# Patient Record
Sex: Female | Born: 1955
Health system: Southern US, Community
[De-identification: ages and names within clinical notes are randomized; demographics above are authoritative.]

## PROBLEM LIST (undated history)

## (undated) DIAGNOSIS — M797 Fibromyalgia: Secondary | ICD-10-CM

## (undated) DIAGNOSIS — Z720 Tobacco use: Secondary | ICD-10-CM

## (undated) DIAGNOSIS — R011 Cardiac murmur, unspecified: Secondary | ICD-10-CM

## (undated) DIAGNOSIS — M1990 Unspecified osteoarthritis, unspecified site: Secondary | ICD-10-CM

## (undated) DIAGNOSIS — I359 Nonrheumatic aortic valve disorder, unspecified: Secondary | ICD-10-CM

## (undated) DIAGNOSIS — I10 Essential (primary) hypertension: Secondary | ICD-10-CM

## (undated) HISTORY — PX: SHOULDER SURGERY: SHX246

## (undated) HISTORY — DX: Essential (primary) hypertension: I10

## (undated) HISTORY — PX: CHOLECYSTECTOMY: SHX55

## (undated) HISTORY — DX: Nonrheumatic aortic valve disorder, unspecified: I35.9

## (undated) HISTORY — DX: Tobacco use: Z72.0

---

## 1998-12-24 ENCOUNTER — Encounter: Payer: Self-pay | Admitting: Internal Medicine

## 1998-12-24 ENCOUNTER — Ambulatory Visit (HOSPITAL_COMMUNITY): Admission: RE | Admit: 1998-12-24 | Discharge: 1998-12-24 | Payer: Self-pay

## 1999-02-11 ENCOUNTER — Ambulatory Visit (HOSPITAL_COMMUNITY): Admission: RE | Admit: 1999-02-11 | Discharge: 1999-02-11 | Payer: Self-pay | Admitting: Internal Medicine

## 1999-06-22 ENCOUNTER — Encounter: Admission: RE | Admit: 1999-06-22 | Discharge: 1999-09-20 | Payer: Self-pay | Admitting: Internal Medicine

## 1999-10-13 ENCOUNTER — Inpatient Hospital Stay (HOSPITAL_COMMUNITY): Admission: EM | Admit: 1999-10-13 | Discharge: 1999-10-15 | Payer: Self-pay | Admitting: Emergency Medicine

## 1999-10-14 ENCOUNTER — Encounter: Payer: Self-pay | Admitting: Neurology

## 2000-01-29 ENCOUNTER — Emergency Department (HOSPITAL_COMMUNITY): Admission: EM | Admit: 2000-01-29 | Discharge: 2000-01-29 | Payer: Self-pay | Admitting: Emergency Medicine

## 2000-07-11 ENCOUNTER — Ambulatory Visit (HOSPITAL_COMMUNITY): Admission: RE | Admit: 2000-07-11 | Discharge: 2000-07-11 | Payer: Self-pay | Admitting: Gastroenterology

## 2000-07-11 ENCOUNTER — Encounter (INDEPENDENT_AMBULATORY_CARE_PROVIDER_SITE_OTHER): Payer: Self-pay | Admitting: Specialist

## 2000-10-17 ENCOUNTER — Encounter: Admission: RE | Admit: 2000-10-17 | Discharge: 2000-10-17 | Payer: Self-pay | Admitting: *Deleted

## 2001-02-14 ENCOUNTER — Ambulatory Visit (HOSPITAL_COMMUNITY): Admission: RE | Admit: 2001-02-14 | Discharge: 2001-02-14 | Payer: Self-pay | Admitting: Gastroenterology

## 2001-12-09 ENCOUNTER — Encounter: Payer: Self-pay | Admitting: Emergency Medicine

## 2001-12-09 ENCOUNTER — Inpatient Hospital Stay (HOSPITAL_COMMUNITY): Admission: EM | Admit: 2001-12-09 | Discharge: 2001-12-10 | Payer: Self-pay | Admitting: Emergency Medicine

## 2001-12-14 HISTORY — PX: NM MYOCAR PERF WALL MOTION: HXRAD629

## 2001-12-21 ENCOUNTER — Ambulatory Visit (HOSPITAL_COMMUNITY): Admission: RE | Admit: 2001-12-21 | Discharge: 2001-12-22 | Payer: Self-pay | Admitting: Cardiology

## 2001-12-21 HISTORY — PX: CARDIAC CATHETERIZATION: SHX172

## 2002-04-10 ENCOUNTER — Encounter: Payer: Self-pay | Admitting: Gastroenterology

## 2002-04-10 ENCOUNTER — Ambulatory Visit (HOSPITAL_COMMUNITY): Admission: RE | Admit: 2002-04-10 | Discharge: 2002-04-10 | Payer: Self-pay | Admitting: Gastroenterology

## 2002-05-16 ENCOUNTER — Encounter (INDEPENDENT_AMBULATORY_CARE_PROVIDER_SITE_OTHER): Payer: Self-pay

## 2002-05-16 ENCOUNTER — Encounter: Payer: Self-pay | Admitting: Surgery

## 2002-05-16 ENCOUNTER — Observation Stay (HOSPITAL_COMMUNITY): Admission: RE | Admit: 2002-05-16 | Discharge: 2002-05-17 | Payer: Self-pay | Admitting: Surgery

## 2002-07-05 ENCOUNTER — Encounter: Admission: RE | Admit: 2002-07-05 | Discharge: 2002-07-05 | Payer: Self-pay | Admitting: Family Medicine

## 2002-07-05 ENCOUNTER — Encounter: Payer: Self-pay | Admitting: Family Medicine

## 2002-11-26 ENCOUNTER — Encounter: Payer: Self-pay | Admitting: Gastroenterology

## 2002-11-26 ENCOUNTER — Ambulatory Visit (HOSPITAL_COMMUNITY): Admission: RE | Admit: 2002-11-26 | Discharge: 2002-11-26 | Payer: Self-pay | Admitting: Gastroenterology

## 2002-11-27 ENCOUNTER — Encounter: Payer: Self-pay | Admitting: Gastroenterology

## 2002-11-27 ENCOUNTER — Ambulatory Visit (HOSPITAL_COMMUNITY): Admission: RE | Admit: 2002-11-27 | Discharge: 2002-11-27 | Payer: Self-pay | Admitting: Gastroenterology

## 2002-11-29 ENCOUNTER — Ambulatory Visit (HOSPITAL_COMMUNITY): Admission: RE | Admit: 2002-11-29 | Discharge: 2002-11-29 | Payer: Self-pay | Admitting: Gastroenterology

## 2003-11-13 ENCOUNTER — Encounter: Admission: RE | Admit: 2003-11-13 | Discharge: 2003-11-13 | Payer: Self-pay | Admitting: Surgery

## 2005-02-09 ENCOUNTER — Ambulatory Visit (HOSPITAL_COMMUNITY): Admission: RE | Admit: 2005-02-09 | Discharge: 2005-02-09 | Payer: Self-pay | Admitting: Gastroenterology

## 2005-02-09 ENCOUNTER — Encounter (INDEPENDENT_AMBULATORY_CARE_PROVIDER_SITE_OTHER): Payer: Self-pay | Admitting: Specialist

## 2008-12-22 ENCOUNTER — Encounter: Admission: RE | Admit: 2008-12-22 | Discharge: 2008-12-22 | Payer: Self-pay | Admitting: Specialist

## 2009-10-20 ENCOUNTER — Encounter: Admission: RE | Admit: 2009-10-20 | Discharge: 2009-10-20 | Payer: Self-pay | Admitting: Specialist

## 2009-12-22 ENCOUNTER — Encounter: Admission: RE | Admit: 2009-12-22 | Discharge: 2010-02-01 | Payer: Self-pay | Admitting: Orthopaedic Surgery

## 2010-09-24 NOTE — Op Note (Signed)
NAMEJANILAH, Murray                        ACCOUNT NO.:  [id number]   MEDICAL RECORD NO.:  [id number]                   PATIENT TYPE:  AMB   LOCATION:  ENDO                                 FACILITY:  [facility]   PHYSICIAN:  Anselmo Rod, M.D.               DATE OF BIRTH:  1955/08/16   DATE OF PROCEDURE:  11/29/2002  DATE OF DISCHARGE:                                 OPERATIVE REPORT   PROCEDURE PERFORMED:  Esophagogastroduodenoscopy.   ENDOSCOPIST:  Anselmo Rod, M.D.   INSTRUMENT USED:  Olympus video panendoscope.   INDICATION FOR PROCEDURE:  A 55 year old African-American female with a  history of severe abdominal pain status post laparoscopic cholecystectomy.  CT scan of the abdomen showed some dilated intrahepatic and extrahepatic  bile duct.  This was not confirmed by abdominal ultrasound; therefore, EGD  is being done to rule out peptic ulcer disease, esophagitis, gastritis, etc.  The patient had normal LFTs during a recent evaluation in the office.   PREPROCEDURE PREPARATION:  Informed consent was procured from the patient.  The patient had fasted for eight hours prior to the procedure.   PREPROCEDURE PHYSICAL:  VITAL SIGNS:  The patient had stable vital signs.  NECK:  Supple.  CHEST:  Clear to auscultation.  S1, S2 regular.  ABDOMEN:  Soft with epigastric tenderness on palpation with guarding.  No  rebound, no rigidity, no hepatosplenomegaly.  Well-healed surgical scars  present from previous laparoscopic cholecystectomy.   DESCRIPTION OF PROCEDURE:  The patient was placed in the left lateral  decubitus position and sedated with 60 mg of Demerol and 6 mg of Versed  intravenously.  Once the patient was adequately sedate and maintained on low-  flow oxygen and continuous cardiac monitoring, the Olympus video  panendoscope was advanced through the mouthpiece over the tongue, into the  esophagus under direct vision.  The entire esophagus appeared normal with no  evidence of ring, stricture, masses, esophagitis, or Barrett's mucosa.  The  scope was then advanced into the stomach.  The entire gastric mucosa and the  proximal small bowel appeared normal.  Retroflexion in the high cardia  revealed a small hiatal hernia.   IMPRESSION:  Normal EGD except for a small hiatal hernia.  No ulcers,  erosions, masses, or polyps seen.    RECOMMENDATIONS:  1. Continue Aciphex.  2. Avoid all nonsteroidals including aspirin for now.  3. Outpatient follow-up in the next one week for further recommendations.                                               Anselmo Rod, M.D.    JNM/MEDQ  D:  11/29/2002  T:  11/30/2002  Job:  [id number]   cc:   Lacretia Leigh. Quintella Reichert, M.D.  [address]  [place]  [place] [address]  Fax: [phone]

## 2010-09-24 NOTE — Procedures (Signed)
[facility]  Patient:    Valerie Murray, Valerie Murray Visit Number: [id number] MRN: [id number]          Service Type: END Location: ENDO Attending Physician:  Charna Elizabeth Dictated by:   Anselmo Rod, M.D. Admit Date:  02/14/2001 Discharge Date: 02/14/2001   CC:         Lacretia Leigh. Quintella Reichert, M.D.   Procedure Report  DATE OF BIRTH:  December 06, 1952.  PROCEDURE:  Esophagogastroduodenoscopy.  ENDOSCOPIST:  Anselmo Rod, M.D.  INSTRUMENT USED:  Olympus video panendoscope.  INDICATION FOR PROCEDURE:  A history of epigastric pain and abnormal fold on previous EGD in the stomach.  Repeat EGD is being done to re-evalaute the patients stomach and rule out any abnormalities versus malignancy.  PREPROCEDURE PREPARATION:  Informed consent was procured from the patient. The patient was fasted for eight hours prior to the procedure.  PREPROCEDURE PHYSICAL:  VITAL SIGNS:  The patient had stable vital signs.  NECK:  Supple.  CHEST:  Clear to auscultation.  S1, S2 regular.  ABDOMEN:  Soft with normal bowel sounds.  Minimal epigastric tenderness on palpation with guarding, no rebound, no rigidity, no hepatosplenomegaly, no masses palpable.  DESCRIPTION OF PROCEDURE:  The patient was placed in the left lateral decubitus position and sedated with 50 mg of Demerol and 5 mg of Versed intravenously.  Once the patient was adequately sedate and maintained on low-flow oxygen and continuous cardiac monitoring, the Olympus video panendoscope was advanced through the mouthpiece, over the tongue, into the esophagus under direct vision.  The entire esophagus appeared normal without evidence of ring, stricture, masses, lesions, esophagitis, or Barretts mucosal  The scope was then advanced to the stomach.  A small hiatal hernia was seen on high retroflexion.  The rest of the gastric mucosa appeared normal, and so did the proximal small bowel.  IMPRESSION:  Normal EGD  except for small hiatal hernia.  RECOMMENDATIONS: 1. Continue with PPIs at this time. 2. Outpatient follow-up in the next two weeks. Dictated by:   Anselmo Rod, M.D. Attending Physician:  Charna Elizabeth DD:  02/22/01 TD:  02/23/01 Job: [id number]

## 2010-09-24 NOTE — H&P (Signed)
[facility]  Patient:    Valerie Murray, Valerie Murray                     MRN: [id number] Adm. Date:  [id number] Attending:  Feliciana Rossetti                         History and Physical  CHIEF COMPLAINT:  Passed out.  HISTORY OF PRESENT ILLNESS:  A 55 year old black female who stood today to go to the bathroom and lost consciousness and went to the floor. She suffered no trauma. She did not have loss of bowel or bladder. She denies palpitations or chest pain, fevers, chills, nausea, vomiting, or diarrhea. She does endorse her longstanding diffuse pain that is worse in her right hand and left great toe.  PHYSICAL EXAMINATION:  Nontoxic in no acute distress. The patient is lying still in bed with paucity of faces, speaking slowly.  HEENT:  Pupils equal round and reactive to light. Funduscopic exam reveals no exudate or hemorrhage. Disk edges are normal.  VITAL SIGNS:  Heart rate 70, blood pressure 120/60 laying down, 120/66 standing, heart rate is 76 and 70 respectively.  NECK:   Supple. Lymph node survey is negative. Mucous membranes are moist. Oropharynx is without lesions.  HEART:  Regular rate and rhythm. No murmurs, rubs or gallops. Normal PMI.  LUNGS:  CTA. Good symmetric air movement.  ABDOMEN:  Soft, no hepatosplenomegaly. No rebound, no guard.  EXTREMITIES:  No CCE.  NEUROLOGIC:  Cranial nerves II-XII, muscle strength within normal limits.  RECTAL/PELVIC:  Refused.  SKIN:  Without rash or breakdown. Decreased range of motion to the left shoulder secondary to pain. Passive range of motion is also diminished.  DATA: CBC, differential, metabolic profile, ANA, sed rate pending. EKG, chest x-ray pending.  SOCIAL HISTORY:  Tobacco use, no alcohol.  FAMILY HISTORY:  No collagen vascular disease.  REVIEW OF SYSTEMS:  No diaphoresis. No shortness of breath. See HPI.  PAST SURGICAL HISTORY:  Shoulder surgery.  PAST MEDICAL HISTORY:  Fibromyalgia,  hypertension.  MEDICATIONS:  Toprol XL 50 q.d., Wellbutrin 400 q.d., Remeron 30 h.s.  ALLERGIES:  None.  ASSESSMENT/PLAN: 1. Syncope:  Suspect neurocardiogenic. The patient refusing cardiac workup.    Increase dose of Toprol and further workup if repeat event. 2. Fibromyalgia:  Apparently the patient has been severely disabled regarding    this issue over the past 15-20 years. Indeed she has remained in bed for as    long as 3 months a stretch when she would use the bathroom by crawling    back and forth to the commode. Will involve Child psychotherapist. 3. Hypertension:  Toprol as above. DD:  10/13/99 TD:  10/14/99 Job: [id number]

## 2010-09-24 NOTE — Procedures (Signed)
[facility]  Patient:    Valerie Murray, BACHTELL                     MRN: [id number] Proc. Date: 07/11/00 Adm. Date:  [id number] Attending:  Charna Elizabeth CC:         Feliciana Rossetti, M.D.   Procedure Report  DATE OF BIRTH:  1955-08-30.  PROCEDURE:  Esophagogastroduodenoscopy with biopsies.  ENDOSCOPIST:  Anselmo Rod, M.D.  INSTRUMENT USED:  Olympus video panendoscope.  INDICATION FOR PROCEDURE:  Epigastric pain not responding to PPIs in a 55 year old African-American female.  Rule out peptic ulcer disease, esophagitis, gastritis, etc.  PREPROCEDURE PREPARATION:  Informed consent was procured from the patient. The patient was fasted for eight hours prior to the procedure.  She also received 80 mg of gentamicin and 1 g of ampicillin prior to the procedure for a murmur.  PREPROCEDURE PHYSICAL:  VITAL SIGNS:  The patient had stable vital signs.  NECK:  Supple.  CHEST:  Clear to auscultation.  S1, S2 regular.  ABDOMEN:  Soft with epigastric tenderness on palpation with guarding.  No rebound or rigidity.  No hepatosplenomegaly.  DESCRIPTION OF PROCEDURE:  The patient was placed in the left lateral decubitus position and sedated with 40 mg of Demerol and 4 mg of Versed intravenously.  Once the patient was adequately sedate and maintained on low-flow oxygen and continuous cardiac monitoring, the Olympus video panendoscope was advanced through the mouthpiece, over the tongue, into the esophagus under direct vision.  The entire esophagus appeared normal without evidence of ring, stricture, masses, lesions, esophagitis, or Barretts mucosa.  The scope was then advanced into the stomach.  A small hiatal hernia was seen on high retroflexion.  There was a prominent gastric fold appreciated on retroflexion and not seen on antegrade view.  This was biopsied for pathology as well.  It was an erythematous fold with no frank ulceration over it.  The  rest of the gastric mucosa appeared normal.  The proximal small bowel appeared normal as well.  The patient tolerated the procedure well without complications.  IMPRESSION: 1. Normal-appearing esophagus. 2. Small hiatal hernia. 3. Prominent gastric fold seen on retroflexion, biopsied for pathology. 4. Normal proximal small bowel and distal half of the stomach.  RECOMMENDATIONS: 1. Await pathology results. 2. Continue PPIs for now. 3. Avoid all nonsteroidals. 4. Outpatient follow-up in the next two weeks. DD:  07/11/00 TD:  07/11/00 Job: [id number]

## 2010-09-24 NOTE — Cardiovascular Report (Signed)
Valerie Murray, Valerie Murray                        ACCOUNT NO.:  [id number]   MEDICAL RECORD NO.:  [id number]                   PATIENT TYPE:  OIB   LOCATION:  5729                                 FACILITY:  [facility]   PHYSICIAN:  Madaline Savage, M.D.             DATE OF BIRTH:  Oct 18, 1955   DATE OF PROCEDURE:  12/21/2001  DATE OF DISCHARGE:  12/22/2001                              CARDIAC CATHETERIZATION   INDICATIONS FOR PROCEDURE:  The patient has had a recent history of chest  pain prompting emergency room evaluation. She has also had some shortness of  breath.  She has chronic tobacco use, hypertension and known chronic aortic  regurgitation that has been stable.  A recent Cardiolite stress test has  shown the possibility of anterior wall ischemia that was felt to be high  risk.  We therefore scheduled the patient for cardiac catheterization today  on an elective outpatient basis.   RESULTS:  HEMODYNAMIC DATA:  Right atrial mean pressure was 7, right ventricle  pressure was 29/5, end-diastolic pressure of 9.  Pulmonary artery pressure  was 30/13, mean of 20.  Pulmonary capillary wedge mean pressure was 12.  A  wave 12, V wave 15.  Central aortic pressure was 160/65, mean of 100.  Left  ventricular pressure was 160/6, end-diastolic pressure 19.  The Fick cardiac  output was 4.9, Fick cardiac index was 3.0. Thermodilution cardiac output  was 4.0, cardiac index 2.5.   ANGIOGRAPHIC RESULTS:  The left main coronary artery was normal.   The left anterior descending coronary artery coursed to the cardiac apex and  gave rise to one major diagonal branch, which was entirely normal. That  diagonal branch arose distal to the first septal perforator branch.   There was an intermediate ramus branch arising from the distal left main  which was normal.   The circumflex was nondominant. It was normal.   The right coronary artery was a large and dominant vessel which was normal.   LEFT  VENTRICULOGRAM:  Left ventricular angiography was normal with normal  wall motion abnormalities. Ejection fraction 60%. The aortic root angiogram  showed moderately severe aortic regurgitation with no ectasia of the aortic  root.  The abdominal aorta showed no evidence of aneurysm formation. Both  renal arteries were entirely normal.   FINAL DIAGNOSES:  1. Chest pain and positive Cardiolite for anterior wall ischemia.  2.     Angiographically patent coronary arteries.  3. Normal left ventricular systolic function.  4. Moderately severe aortic regurgitation.                                               Madaline Savage, M.D.    WHG/MEDQ  D:  12/21/2001  T:  12/25/2001  Job:  [id number]   cc:  Lacretia Leigh. Quintella Reichert, M.D.   Cardiac Catheterization Laboratory

## 2010-09-24 NOTE — Consult Note (Signed)
[facility]  Patient:    Valerie Murray, Valerie Murray                     MRN: [id number] Proc. Date: 10/13/99 Adm. Date:  [id number] Attending:  Feliciana Rossetti CC:         Feliciana Rossetti, M.D.                          Consultation Report  DATE OF BIRTH:  [birth year]  REASON FOR CONSULTATION:  This 55 year old right handed black married female is seen in the emergency room and admitted by Dr. Quintella Reichert following an episode of unwitnessed syncope.  HISTORY OF PRESENT ILLNESS:  Ms. Klus states that she has had a 12 year history of fibromyalgia currently treated with Wellbutrin medication and Remeron. She also has a known history of hypertension for 15 years. Over the last 3 months, she has had episodes of a vague light headed sensation at times described as a whirling lasting as long as 20-30 minutes without associated nausea and vomiting, chest pain or palpitations. The frequency of these episodes can be several times per week, usually occurs in the morning when she first gets out of bed. She has also had 3 episodes of blackout spells which have been unwitnessed. These are not associated with any warning other than the light headed sensation. They are not associated with waking with having bitten her tongue or lost her urine or bowel control. She has not had these witnessed however. There has been no tongue soreness afterwards. Today, she had an episode of a light headed sensation followed by a blacking out that was unwitnessed. There were no apparent injuries and she came to the emergency room. She has not had any headaches, vision loss, double vision, swallowing problems, etc.  MEDICATIONS:  Wellbutrin 150 mg twice per day and 100 mg q.d., Remeron 30 mg at night, Lodine XL 400 mg once per day, Provigil 200 mg which she has not started taking yet and Toprol XL 50 mg q.d. for hypertension.  PAST MEDICAL HISTORY:  Significant for hypertension for 15 years,  heart murmurs and fibromyalgia characterized by frequent leg cramps. She states this has been evaluated at [facility] and confirmed to be fibromyalgia.  She has had studies in the emergency room including comprehensive metabolic panel which was normal, magnesium which was 2.1, a 12-lead EKG which was normal. PHYSICAL EXAMINATION:  GENERAL:  Revealed a well-developed, pleasant, black female in no acute distress. Weight was not obtained.  VITAL SIGNS:  Sitting blood pressure in the right and left 130/80, lying blood pressure in the right arm 130/80, standing in the right arm 130/80. Heart rate was 84.  NECK:  There were no bruits. Neck flexion extension maneuvers were unremarkable.  MENTAL STATUS:  She was alert and oriented x 3. She followed, 1, 2 and 3 step commands. Cranial nerves examination revealed visual fields to be full. The disks were flat. Extraocular movements were full. Cornuals were present and facial sensation was equal. There was no facial motor asymmetry. Hearing was present with air conduction greater than bone conduction. The tongue was midline, the uvula was midline and gags were present. Sternocleidomastoid and trapezius testing were normal.  MOTOR:  Revealed 5/5 strength but she gave very little effort at times, but when she did give effort there was 5/5. I did not see any definite spasms. She complained of muscle cramps while standing particularly in her  left foot but I could not see any. Her sensory examination was intact to pinprick, touch, joint position and vibration testing. She had an outstretched hand and arm tremor. Deep tendon reflexes were 2+ and plantar responses were down going.  IMPRESSION: 1. Unwitnessed syncope, code 780.2. 2. Vertigo, code 784.0. 3. Fibromyalgia, code 729.1.  PLAN:  Obtain and EEG, MRI study, have her go to telemetry per Dr. Quintella Reichert and consider tilt table testing after the above. DD:  10/14/99 TD:  10/14/99 Job:  [id number]

## 2010-09-24 NOTE — Discharge Summary (Signed)
   Valerie Murray, Valerie Murray                        ACCOUNT NO.:  [id number]   MEDICAL RECORD NO.:  [id number]                   PATIENT TYPE:  INP   LOCATION:  2002                                 FACILITY:  [facility]   PHYSICIAN:  Beverly A. Delanna Ahmadi, N.P.               DATE OF BIRTH:  1955-10-26   DATE OF ADMISSION:  12/09/2001  DATE OF DISCHARGE:  12/10/2001                                 DISCHARGE SUMMARY   HISTORY OF PRESENT ILLNESS:  The patient is a 55 year old African American  female patient with a prior medical history of hypertension, GERD, and  fibromyalgia and also aortic insufficiency.  She came into the ER with  complaints of chest pain starting about 2 o'clock in the day.  She was seen  by Dr. Jenne Campus about 8:30 at night in the ER.  It was decided that she would  be admitted overnight for observation and also to rule out an MI.  On December 10, 2001, she was seen by Dr. Jacinto Halim.  Her enzymes thus far have been  negative.  There is one set pending.  She will be discharged if the third  set is also negative.  It was thought that her pain was not ischemic  related; however, she will undergo an outpatient Cardiolite and also  echocardiogram to follow her aortic insufficiency.   DISCHARGE MEDICATIONS:  1. Nexium 40 mg once per day.  2. Aspirin 81 mg every day.  3. Toprol XL 50 mg once per day.   ACTIVITY:  As tolerated.   DIET:  She should be on a low saturated fat diet.   LABORATORY DATA:  Her lipid profile, her last CK-MB, and troponin were  pending at the time of this dictation.  Her other labs are unremarkable.   FOLLOW UP:  She has a followup with Dr. Elsie Lincoln August 21 at 3 p.m.  She will  have a Persantine Cardiolite and a 2-D echocardiogram done on August 8.   DISCHARGE DIAGNOSES:  1. Chest pain thought not related to ischemic coronary artery disease.  2. Aortic insufficiency.  3. Fibromyalgia.  4. Gastroesophageal reflux disease.              Beverly A. Delanna Ahmadi, N.P.    BAB/MEDQ  D:  12/10/2001  T:  12/14/2001  Job:  [id number]

## 2010-09-24 NOTE — Op Note (Signed)
NAMELIBNI, FUSARO                        ACCOUNT NO.:  [id number]   MEDICAL RECORD NO.:  [id number]                   PATIENT TYPE:  AMB   LOCATION:  DAY                                  FAC[facility]   PHYSICIAN:  Abigail Miyamoto, M.D.              DATE OF BIRTH:  1955/11/05   DATE OF PROCEDURE:  05/16/2002  DATE OF DISCHARGE:                                 OPERATIVE REPORT   PREOPERATIVE DIAGNOSIS:  Biliary dyskinesia.   POSTOPERATIVE DIAGNOSIS:  Biliary dyskinesia.   PROCEDURE:  Laparoscopic cholecystectomy with intraoperative cholangiogram.   SURGEON:  Abigail Miyamoto, M.D.   ASSISTANT:  Ollen Gross. Carolynne Edouard, M.D.   ANESTHESIA:  General endotracheal anesthesia.   ESTIMATED BLOOD LOSS:  Minimal.   FINDINGS:  The patient was found to have a normal cholangiogram.   DESCRIPTION OF PROCEDURE:  The patient was brought to the operating room and  positively identified.  She was placed supine on the operating room table,  and general anesthesia was induced.  Her abdomen was then prepped and draped  in the usual sterile fashion.  Using a #15 blade, a small transverse  incision was made just below the umbilicus through a previous incision.  The  incision was carried down to the fascia, which was then opened with a  scalpel.  A hemostat was used to pass into the peritoneal cavity.  A 0  Vicryl pursestring suture was then placed around the fascial opening.  The  Hasson port was placed through the opening and insufflation of the abdomen  begun.  An 11 mm port was placed in the patient's epigastrium and two 5 mm  ports were placed in the patient's right flank under direct vision.  The  gallbladder was then identified and retracted above the liver bed.  Several  adhesions to the gallbladder were then taken down bluntly.  The cystic duct  was then dissected out.  It was clipped once distally and promptly  transected with scissors.  An angiocatheter was then inserted in the right  upper quadrant under direct vision.  The cholangiocatheter was then passed  through the angiocatheter and placed into the cystic duct.  The  cholangiogram was then performed under direct fluoroscopy.  Good flow of  contrast was seen into the entire biliary system, common bile duct, and  duodenum without evidence of abnormalities.  The cholangiocatheter was then  removed under direct vision.  The cystic duct was then clipped three times  proximally and completely transected.  The cystic artery and a branch were  then clipped twice proximally and distally and transected with scissors.  The gallbladder was then dissected free from the liver bed with the  electrocautery.  Hemostasis appeared to be achieved in the liver bed with  the cautery.  The gallbladder was then removed through the incision at the  umbilicus.  The 0 Vicryl at the umbilicus was tied in place, closing the  fascial defect.  The liver bed was again examined and hemostasis achieved.  All ports were then removed under direct vision until the abdomen was  deflated.  All incisions were then anesthetized with 0.25% Marcaine and then  closed with 4-0 Monocryl subcuticular sutures.  Steri-Strips,  gauze, and tape were then applied.  The patient tolerated the procedure  well.  All sponge, needle, and instrument counts were correct at the end of  the procedure.  The patient was then extubated in the operating room and  taken in stable condition to the recovery room.                                               Abigail Miyamoto, M.D.    DB/MEDQ  D:  05/16/2002  T:  05/16/2002  Job:  [id number]

## 2010-09-24 NOTE — Discharge Summary (Signed)
[facility]  Patient:    Valerie Murray, Valerie Murray                     MRN: [id number] Adm. Date:  [id number] Disc. Date: [id number] Attending:  Feliciana Rossetti                           Discharge Summary  DIAGNOSES: 1. Syncope and collapse. 2. Hypertension. 3. Fibromyalgia. 4. Dizziness. 5. Giddiness.  REASON FOR HOSPITALIZATION:  Syncope and collapse.  HOSPITAL COURSE:  Patient was admitted to a general medical bed and neurologic consult was obtained by Dr. Genene Churn. Love.  The plan was for electroencephalogram, MRI and cardiologic consult.  Social work consult was obtained for patient.  Neurologic workup was unrevealing and then patient refused cardiologic workup.  I stressed the importance of this to patient and she again refused this workup.  Patient stabilized and had no further syncopal episode and denied any dizziness upon standing.  CONDITION AT DISCHARGE:  Patient was well-appearing, ambulating with ease, with normal and stable vital signs.  DISCHARGE MEDICATIONS: 1. Wellbutrin 150 mg b.i.d. ______ . 2. Remeron 30 mg q.h.s. 3. Ritalin 5 mg b.i.d.  FOLLOWUP:  Followup was with Dr. Feliciana Rossetti one week following discharge. DD:  11/28/99 TD:  12/01/99 Job: [id number]

## 2010-09-24 NOTE — Op Note (Signed)
NAME:  Valerie Murray, Valerie Murray            ACCOUNT NO.:  [id number]   MEDICAL RECORD NO.:  [id number]          PATIENT TYPE:  AMB   LOCATION:  ENDO                         FACILITY:  [facility]   PHYSICIAN:  Anselmo Rod, M.D.  DATE OF BIRTH:  06/08/1955   DATE OF PROCEDURE:  02/09/2005  DATE OF DISCHARGE:                                 OPERATIVE REPORT   PROCEDURE PERFORMED:  Colonoscopy with random biopsies.   ENDOSCOPIST:  Charna Elizabeth, M.D.   INSTRUMENT USED:  Olympus video colonoscope.   INDICATIONS FOR PROCEDURE:  The patient is a 55 year old African-American  female undergoing a screening colonoscopy. The patient has a history of  change in bowel habits, worsening diarrhea and a history of anemia.  Rule  out colonic polyps, masses, etc.   PREPROCEDURE PREPARATION:  Informed consent was procured from the patient.  The patient was fasted for eight hours prior to the procedure and prepped  with a bottle of magnesium citrate and a gallon of GoLytely the night prior  to the procedure.  The risks and benefits of the procedure including a 10%  miss rate for polyps or cancers was discussed with the patient as well.   PREPROCEDURE PHYSICAL:  The patient had stable vital signs.  Neck supple.  Chest clear to auscultation.  S1 and S2 regular.  Abdomen soft with normal  bowel sounds.   DESCRIPTION OF PROCEDURE:  The patient was placed in left lateral decubitus  position and sedated with 40 mg of Demerol and 6 mg of Versed in slow  incremental doses.  Once the patient was adequately sedated and maintained  on low flow oxygen and continuous cardiac monitoring, the Olympus video  colonoscope was advanced from the rectum to the cecum.  The appendicular  orifice and ileocecal valve were clearly visualized and photographed.  Terminal ileum appeared healthy and without lesions.  No other masses or  polyps were seen.  A couple of very early diverticula were seen in the left  and right colon.  Random  colon biopsies were done to rule out collagenous  versus microscopic colitis.  The patient tolerated the procedure well  without complication.   IMPRESSION:  1.  Loss of vascular markings seen throughout the colonic mucosa.  2.  Normal terminal ileum.  3.  Few early diverticula.  4.  No masses or polyps recognized.   RECOMMENDATIONS:  1.  Await pathology results.  2.  Avoid all nonsteroidals for now.  3.  Repeat colonoscopy in the next 10 years unless the patient develops any      abnormal symptoms in the interim.  4.  Outpatient followup in the next four weeks for further recommendations.      Anselmo Rod, M.D.  Electronically Signed     JNM/MEDQ  D:  02/09/2005  T:  02/09/2005  Job:  [id number]   cc:   Lacretia Leigh. Quintella Reichert, M.D.  [address]  [place]  [place] [address]

## 2011-03-15 ENCOUNTER — Ambulatory Visit: Payer: Self-pay

## 2011-03-22 ENCOUNTER — Other Ambulatory Visit: Payer: Self-pay | Admitting: Gastroenterology

## 2011-11-09 ENCOUNTER — Emergency Department (HOSPITAL_COMMUNITY): Payer: Medicare Other

## 2011-11-09 ENCOUNTER — Encounter (HOSPITAL_COMMUNITY): Payer: Self-pay | Admitting: Emergency Medicine

## 2011-11-09 ENCOUNTER — Emergency Department (HOSPITAL_COMMUNITY)
Admission: EM | Admit: 2011-11-09 | Discharge: 2011-11-09 | Disposition: A | Discharge status: Home / Self Care | Payer: Medicare Other | Attending: Emergency Medicine | Admitting: Emergency Medicine

## 2011-11-09 DIAGNOSIS — M25579 Pain in unspecified ankle and joints of unspecified foot: Secondary | ICD-10-CM | POA: Insufficient documentation

## 2011-11-09 DIAGNOSIS — F17200 Nicotine dependence, unspecified, uncomplicated: Secondary | ICD-10-CM | POA: Insufficient documentation

## 2011-11-09 DIAGNOSIS — M25476 Effusion, unspecified foot: Secondary | ICD-10-CM | POA: Insufficient documentation

## 2011-11-09 DIAGNOSIS — M129 Arthropathy, unspecified: Secondary | ICD-10-CM | POA: Insufficient documentation

## 2011-11-09 DIAGNOSIS — IMO0002 Reserved for concepts with insufficient information to code with codable children: Secondary | ICD-10-CM | POA: Insufficient documentation

## 2011-11-09 DIAGNOSIS — S9030XA Contusion of unspecified foot, initial encounter: Secondary | ICD-10-CM | POA: Insufficient documentation

## 2011-11-09 DIAGNOSIS — M25473 Effusion, unspecified ankle: Secondary | ICD-10-CM | POA: Insufficient documentation

## 2011-11-09 DIAGNOSIS — S9031XA Contusion of right foot, initial encounter: Secondary | ICD-10-CM

## 2011-11-09 DIAGNOSIS — IMO0001 Reserved for inherently not codable concepts without codable children: Secondary | ICD-10-CM | POA: Insufficient documentation

## 2011-11-09 DIAGNOSIS — M79609 Pain in unspecified limb: Secondary | ICD-10-CM | POA: Insufficient documentation

## 2011-11-09 HISTORY — DX: Unspecified osteoarthritis, unspecified site: M19.90

## 2011-11-09 HISTORY — DX: Cardiac murmur, unspecified: R01.1

## 2011-11-09 HISTORY — DX: Fibromyalgia: M79.7

## 2011-11-09 MED ORDER — HYDROCODONE-ACETAMINOPHEN 5-325 MG PO TABS: 1.0000 | ORAL_TABLET | ORAL | Status: AC | PRN

## 2011-11-09 MED ORDER — HYDROCODONE-ACETAMINOPHEN 5-325 MG PO TABS
1.0000 | ORAL_TABLET | Freq: Once | ORAL | Status: AC
  Administered 2011-11-09: 1 via ORAL
  Filled 2011-11-09: qty 1

## 2011-11-09 NOTE — ED Provider Notes (Signed)
Medical screening examination/treatment/procedure(s) were performed by non-physician practitioner and as supervising physician I was immediately available for consultation/collaboration.    Vida Roller, MD 11/09/11 [phone]

## 2011-11-09 NOTE — ED Notes (Signed)
Pt states she hit her right foot on a cabinet this afternoon and as the day has progressed the pain has increased and she is unable to bear weight on it   Pt states the pain is mainly in the foot on the outside proximal to the 5th toe

## 2011-11-09 NOTE — ED Provider Notes (Signed)
History     CSN: [id number]  Arrival date & time 11/09/11  2025   First MD Initiated Contact with Patient 11/09/11 2140      Chief Complaint  Patient presents with  . Foot Pain    (Consider location/radiation/quality/duration/timing/severity/associated sxs/prior treatment) HPI Comments: Patient here with right foot pain s/p hitting her foot on the base of a cabinet this evening - she states that she has been using ice and elevation without relief of the pain - reports pain to right lateral foot at the MTP joint - states that she is unable to walk since the event because of the pain.  Patient is a 56 y.o. female presenting with lower extremity pain. The history is provided by the patient. No language interpreter was used.  Foot Pain This is a new problem. The current episode started today. The problem occurs constantly. The problem has been unchanged. Associated symptoms include arthralgias and joint swelling. Pertinent negatives include no abdominal pain, anorexia, change in bowel habit, chest pain, chills, congestion, coughing, diaphoresis, fatigue, fever, headaches, myalgias, nausea, neck pain, numbness, rash, sore throat, swollen glands, urinary symptoms, vertigo, visual change, vomiting or weakness. The symptoms are aggravated by standing and walking. She has tried nothing for the symptoms. The treatment provided no relief.    Past Medical History  Diagnosis Date  . Heart murmur   . Arthritis   . Fibromyalgia     Past Surgical History  Procedure Date  . Shoulder surgery   . Cholecystectomy     Family History  Problem Relation Age of Onset  . Hypertension Other     History  Substance Use Topics  . Smoking status: Current Everyday Smoker    Types: Cigarettes  . Smokeless tobacco: Not on file  . Alcohol Use: Yes    OB History    Grav Para Term Preterm Abortions TAB SAB Ect Mult Living                  Review of Systems  Constitutional: Negative for fever,  chills, diaphoresis and fatigue.  HENT: Negative for congestion, sore throat and neck pain.   Respiratory: Negative for cough.   Cardiovascular: Negative for chest pain.  Gastrointestinal: Negative for nausea, vomiting, abdominal pain, anorexia and change in bowel habit.  Musculoskeletal: Positive for joint swelling and arthralgias. Negative for myalgias.  Skin: Negative for rash.  Neurological: Negative for vertigo, weakness, numbness and headaches.  All other systems reviewed and are negative.    Allergies  Review of patient's allergies indicates no known allergies.  Home Medications   Current Outpatient Rx  Name Route Sig Dispense Refill  . CALCIUM + D PO Oral Take 1 tablet by mouth once a week.    Marland Kitchen OLMESARTAN MEDOXOMIL 40 MG PO TABS Oral Take 40 mg by mouth daily.      BP 169/71  Pulse 74  Temp 98.4 F (36.9 C) (Oral)  Resp 18  SpO2 98%  Physical Exam  Nursing note and vitals reviewed. Constitutional: She is oriented to person, place, and time. She appears well-developed and well-nourished. No distress.  HENT:  Head: Normocephalic and atraumatic.  Right Ear: External ear normal.  Left Ear: External ear normal.  Nose: Nose normal.  Mouth/Throat: Oropharynx is clear and moist. No oropharyngeal exudate.  Eyes: Conjunctivae are normal. Pupils are equal, round, and reactive to light. No scleral icterus.  Neck: Normal range of motion. Neck supple.  Cardiovascular: Normal rate, regular rhythm and normal heart sounds.  Exam reveals no gallop and no friction rub.   No murmur heard. Pulmonary/Chest: Effort normal and breath sounds normal. No respiratory distress. She has no wheezes. She has no rales. She exhibits no tenderness.  Abdominal: Soft. Bowel sounds are normal. She exhibits no distension. There is no tenderness.  Musculoskeletal:       Right foot: She exhibits tenderness, bony tenderness and swelling. She exhibits normal range of motion, normal capillary refill and  no deformity.       Feet:  Lymphadenopathy:    She has no cervical adenopathy.  Neurological: She is alert and oriented to person, place, and time. No cranial nerve deficit. She exhibits normal muscle tone. Coordination normal.  Skin: Skin is warm and dry. No rash noted. No erythema. No pallor.  Psychiatric: She has a normal mood and affect. Her behavior is normal. Judgment and thought content normal.    ED Course  Procedures (including critical care time)  Labs Reviewed - No data to display Dg Foot Complete Right  11/09/2011  *RADIOLOGY REPORT*  Clinical Data: Lateral foot pain, swelling.None  RIGHT FOOT COMPLETE - 3+ VIEW  Comparison: None.  Findings: No acute bony abnormality.  Specifically, no fracture, subluxation, or dislocation.  Soft tissues are intact.  IMPRESSION: Normal study.  Original Report Authenticated By: Cyndie Chime, M.D.     Right foot contusion   MDM  Patient here with right lateral foot contusion - no evidence of fracture - will place in post op shoe and crutches - pain control and ortho follow up        Scarlette Calico C. [place], [place] 11/09/11 2212

## 2011-12-01 HISTORY — PX: US ECHOCARDIOGRAPHY: HXRAD669

## 2012-12-15 ENCOUNTER — Encounter: Payer: Self-pay | Admitting: *Deleted

## 2012-12-18 ENCOUNTER — Encounter: Payer: Self-pay | Admitting: Cardiovascular Disease

## 2012-12-19 ENCOUNTER — Encounter: Payer: Self-pay | Admitting: Cardiovascular Disease

## 2012-12-19 ENCOUNTER — Ambulatory Visit (INDEPENDENT_AMBULATORY_CARE_PROVIDER_SITE_OTHER): Payer: Medicare Other | Admitting: Cardiovascular Disease

## 2012-12-19 VITALS — BP 132/70 | HR 65 | Resp 16 | Ht 67.0 in | Wt 133.4 lb

## 2012-12-19 DIAGNOSIS — Q231 Congenital insufficiency of aortic valve: Secondary | ICD-10-CM

## 2012-12-19 DIAGNOSIS — K589 Irritable bowel syndrome without diarrhea: Secondary | ICD-10-CM

## 2012-12-19 DIAGNOSIS — I359 Nonrheumatic aortic valve disorder, unspecified: Secondary | ICD-10-CM

## 2012-12-19 DIAGNOSIS — Q2381 Bicuspid aortic valve: Secondary | ICD-10-CM

## 2012-12-19 DIAGNOSIS — R0600 Dyspnea, unspecified: Secondary | ICD-10-CM

## 2012-12-19 DIAGNOSIS — J4489 Other specified chronic obstructive pulmonary disease: Secondary | ICD-10-CM

## 2012-12-19 DIAGNOSIS — F17200 Nicotine dependence, unspecified, uncomplicated: Secondary | ICD-10-CM

## 2012-12-19 DIAGNOSIS — I10 Essential (primary) hypertension: Secondary | ICD-10-CM

## 2012-12-19 DIAGNOSIS — R0989 Other specified symptoms and signs involving the circulatory and respiratory systems: Secondary | ICD-10-CM

## 2012-12-19 DIAGNOSIS — Z720 Tobacco use: Secondary | ICD-10-CM

## 2012-12-19 DIAGNOSIS — M797 Fibromyalgia: Secondary | ICD-10-CM

## 2012-12-19 DIAGNOSIS — J449 Chronic obstructive pulmonary disease, unspecified: Secondary | ICD-10-CM

## 2012-12-19 DIAGNOSIS — I351 Nonrheumatic aortic (valve) insufficiency: Secondary | ICD-10-CM

## 2012-12-19 DIAGNOSIS — IMO0001 Reserved for inherently not codable concepts without codable children: Secondary | ICD-10-CM

## 2012-12-19 NOTE — Patient Instructions (Addendum)
Your physician has requested that you have an echocardiogram. Echocardiography is a painless test that uses sound waves to create images of your heart. It provides your doctor with information about the size and shape of your heart and how well your heart's chambers and valves are working. This procedure takes approximately one hour. There are no restrictions for this procedure.  WE WILL CALL YOU WITH THE RESULTS.  Your physician recommends that you schedule a follow-up appointment in: ONE YEAR

## 2012-12-28 ENCOUNTER — Encounter: Payer: Self-pay | Admitting: Cardiovascular Disease

## 2012-12-28 DIAGNOSIS — M797 Fibromyalgia: Secondary | ICD-10-CM | POA: Insufficient documentation

## 2012-12-28 DIAGNOSIS — Q231 Congenital insufficiency of aortic valve: Secondary | ICD-10-CM | POA: Insufficient documentation

## 2012-12-28 DIAGNOSIS — F17200 Nicotine dependence, unspecified, uncomplicated: Secondary | ICD-10-CM | POA: Insufficient documentation

## 2012-12-28 DIAGNOSIS — J449 Chronic obstructive pulmonary disease, unspecified: Secondary | ICD-10-CM | POA: Insufficient documentation

## 2012-12-28 DIAGNOSIS — K589 Irritable bowel syndrome without diarrhea: Secondary | ICD-10-CM | POA: Insufficient documentation

## 2012-12-28 DIAGNOSIS — I10 Essential (primary) hypertension: Secondary | ICD-10-CM | POA: Insufficient documentation

## 2012-12-28 DIAGNOSIS — I351 Nonrheumatic aortic (valve) insufficiency: Secondary | ICD-10-CM | POA: Insufficient documentation

## 2012-12-28 NOTE — Assessment & Plan Note (Signed)
If her symptoms worsen it'll be worthwhile for her to undergo formal pulmonary function tests to confirm this diagnosis. This will also be necessary if she is to undergo aortic valve replacement with sternotomy.

## 2012-12-28 NOTE — Progress Notes (Signed)
Patient ID: Valerie Murray, female   DOB: 08-09-1955, 57 y.o.   MRN: [id number]     Reason for office visit Follow-up valvular heart disease  Valerie Murray has had progressively worsening aortic insufficiency well documented in  notes going back to the 1980s. Over the last several years there has been substantial worsening of the severity of the aortic insufficiency. Her last echocardiogram showed moderate to severe insufficiency and also documented moderate pulmonary hypertension.  She also continues to smoke and probably has COPD. She also wears diagnoses of fibromyalgia irritable bowel syndrome. She is well treated systemic hypertension  She describes exertional dyspnea. She does not have chest pain or syncope. No Known Allergies  Current Outpatient Prescriptions  Medication Sig Dispense Refill  . Calcium Carbonate-Vitamin D (CALCIUM + D PO) Take 1 tablet by mouth once a week.      Marland Kitchen HYDROcodone-acetaminophen (NORCO) 10-325 MG per tablet Take by mouth as needed.      Marland Kitchen olmesartan (BENICAR) 40 MG tablet Take 40 mg by mouth daily.       No current facility-administered medications for this visit.    Past Medical History  Diagnosis Date  . Heart murmur   . Arthritis   . Fibromyalgia   . Aortic valve disease     severe AI  . Systemic hypertension   . Tobacco abuse     Past Surgical History  Procedure Laterality Date  . Shoulder surgery    . Cholecystectomy    . US echocardiography  12/01/2011    mod.to severe AI,mild valvular aortic stenosis,LA mod. dilated,trace MR,mild to mod. TR,severe pulmonary hypertension,trace PI  . Nm myocar perf wall motion  12/14/2001    anterior wall ischemia  . Cardiac catheterization  12/21/2001    moderately severe aortic regurg.,normal patent coronary arteries    Family History  Problem Relation Age of Onset  . Hypertension Other   . Stroke Mother   . Heart attack Father   . Cancer Sister     breast    History   Social History  .  Marital Status: Married    Spouse Name: N/A    Number of Children: N/A  . Years of Education: N/A   Occupational History  . Not on file.   Social History Main Topics  . Smoking status: Current Every Day Smoker -- 0.25 packs/day    Types: Cigarettes  . Smokeless tobacco: Not on file  . Alcohol Use: Yes     Comment: occas.  . Drug Use: No  . Sexual Activity: Not on file   Other Topics Concern  . Not on file   Social History Narrative  . No narrative on file    Review of systems: The patient specifically denies any chest pain at rest or with exertion, dyspnea at rest, orthopnea, paroxysmal nocturnal dyspnea, syncope, palpitations, focal neurological deficits, intermittent claudication, lower extremity edema, unexplained weight gain, cough, hemoptysis or wheezing.  The patient also denies abdominal pain, nausea, vomiting, dysphagia,  polyuria, polydipsia, dysuria, hematuria, frequency, urgency, abnormal bleeding or bruising, fever, chills, unexpected weight changes, mood swings, change in skin or hair texture, change in voice quality, auditory or visual problems, allergic reactions or rashes, new musculoskeletal complaints other than usual "aches and pains". On bouts of diarrhea or constipation.   PHYSICAL EXAM BP 132/70  Pulse 65  Ht 5\' 7"  (1.702 m)  Wt 133 lb 6.4 oz (60.51 kg)  BMI 20.89 kg/m2  General: Alert, oriented x3, no distress Head: no  evidence of trauma, PERRL, EOMI, no exophtalmos or lid lag, no myxedema, no xanthelasma; normal ears, nose and oropharynx Neck: normal jugular venous pulsations and no hepatojugular reflux; brisk carotid pulses without delay and no carotid bruits Chest: clear to auscultation, no signs of consolidation by percussion or palpation, normal fremitus, symmetrical and full respiratory excursions Cardiovascular: Dynamic but nondisplaced apical impulse, regular rhythm, normal first and second heart sounds, no rubs or gallops, distal clip is heard.  Grade 3/6 decrescendo diastolic murmur in the aortic focus. The murmur radiates up towards her right sternal border and the carotids. There is also an accompanying systolic 3/6  early peaking systolic murmur Abdomen: no tenderness or distention, no masses by palpation, no abnormal pulsatility or arterial bruits, normal bowel sounds, no hepatosplenomegaly Extremities: no clubbing, cyanosis or edema; 2+ radial, ulnar and brachial pulses bilaterally; 2+ right femoral, posterior tibial and dorsalis pedis pulses; 2+ left femoral, posterior tibial and dorsalis pedis pulses; no subclavian or femoral bruits Neurological: grossly nonfocal   EKG: NSR  Lipid Panel  No results found for this basename: chol, trig, hdl, cholhdl, vldl, ldlcalc    BMET No results found for this basename: na, k, cl, co2, glucose, bun, creatinine, calcium, gfrnonaa, gfraa     ASSESSMENT AND PLAN Severe aortic insufficiency Valerie Murray has severe aortic insufficiency. This is most likely due to a bicuspid aortic valve. The left ventricle is normal in size and systolic left ventricular function was normal by echocardiogram performed in July of 2013. She complains of shortness of breath but it is very challenging to distinguish her dyspnea from smoking-related lung disease. Similarly her last echo described severe pulmonary hypertension with a PA pressure of 70 mm mercury. Whether this is secondary to lung disease or aortic insufficiency is unclear. I think if there is any evidence of left ventricular dysfunction she should undergo aortic valve replacement. This should be assessed yearly with echocardiography, according to valve disease management guidelines. She is on appropriate agent for her blood pressure (angiotensin receptor blocker) to help reduce the significance of aortic insufficiency.  COPD (chronic obstructive pulmonary disease) If her symptoms worsen it'll be worthwhile for her to undergo formal pulmonary function  tests to confirm this diagnosis. This will also be necessary if she is to undergo aortic valve replacement with sternotomy.  Tobacco abuse We spent a long time addressing the benefits of smoking cessation. This will be critically important before she would have any chest surgery. Should focus on smoking cessation as an immediate and long-term goal. We discussed modalities to achieve this.  HTN (hypertension) Satisfactory control.  Orders Placed This Encounter  Procedures  . EKG 12-Lead  . 2D Echocardiogram without contrast   Meds ordered this encounter  Medications  . HYDROcodone-acetaminophen (NORCO) 10-325 MG per tablet    Sig: Take by mouth as needed.    Junious Silk, MD, [facility] [phone] office [phone] pager

## 2012-12-28 NOTE — Assessment & Plan Note (Signed)
We spent a long time addressing the benefits of smoking cessation. This will be critically important before she would have any chest surgery. Should focus on smoking cessation as an immediate and long-term goal. We discussed modalities to achieve this.

## 2012-12-28 NOTE — Assessment & Plan Note (Signed)
Valerie Murray has severe aortic insufficiency. This is most likely due to a bicuspid aortic valve. The left ventricle is normal in size and systolic left ventricular function was normal by echocardiogram performed in July of 2013. She complains of shortness of breath but it is very challenging to distinguish her dyspnea from smoking-related lung disease. Similarly her last echo described severe pulmonary hypertension with a PA pressure of 70 mm mercury. Whether this is secondary to lung disease or aortic insufficiency is unclear. I think if there is any evidence of left ventricular dysfunction she should undergo aortic valve replacement. This should be assessed yearly with echocardiography, according to valve disease management guidelines. She is on appropriate agent for her blood pressure (angiotensin receptor blocker) to help reduce the significance of aortic insufficiency.

## 2012-12-28 NOTE — Assessment & Plan Note (Signed)
Satisfactory control. 

## 2012-12-31 ENCOUNTER — Ambulatory Visit (HOSPITAL_COMMUNITY)
Admission: RE | Admit: 2012-12-31 | Discharge: 2012-12-31 | Disposition: A | Discharge status: Home / Self Care | Payer: PRIVATE HEALTH INSURANCE | Source: Ambulatory Visit | Attending: Cardiovascular Disease | Admitting: Cardiovascular Disease

## 2012-12-31 DIAGNOSIS — R0600 Dyspnea, unspecified: Secondary | ICD-10-CM

## 2012-12-31 DIAGNOSIS — I359 Nonrheumatic aortic valve disorder, unspecified: Secondary | ICD-10-CM | POA: Insufficient documentation

## 2012-12-31 DIAGNOSIS — I517 Cardiomegaly: Secondary | ICD-10-CM | POA: Insufficient documentation

## 2012-12-31 DIAGNOSIS — I059 Rheumatic mitral valve disease, unspecified: Secondary | ICD-10-CM | POA: Insufficient documentation

## 2012-12-31 DIAGNOSIS — I079 Rheumatic tricuspid valve disease, unspecified: Secondary | ICD-10-CM | POA: Insufficient documentation

## 2012-12-31 NOTE — Progress Notes (Signed)
2D Echo Performed 12/31/2012    Kennethia Lynes, RCS  

## 2013-09-12 IMAGING — CR DG FOOT COMPLETE 3+V*R*
3 series · 3 of 3 positions shown · non-contrast
Comparison: None.

CLINICAL DATA: Lateral foot pain, swelling.None

RIGHT FOOT COMPLETE - 3+ VIEW

[x foot ap right]
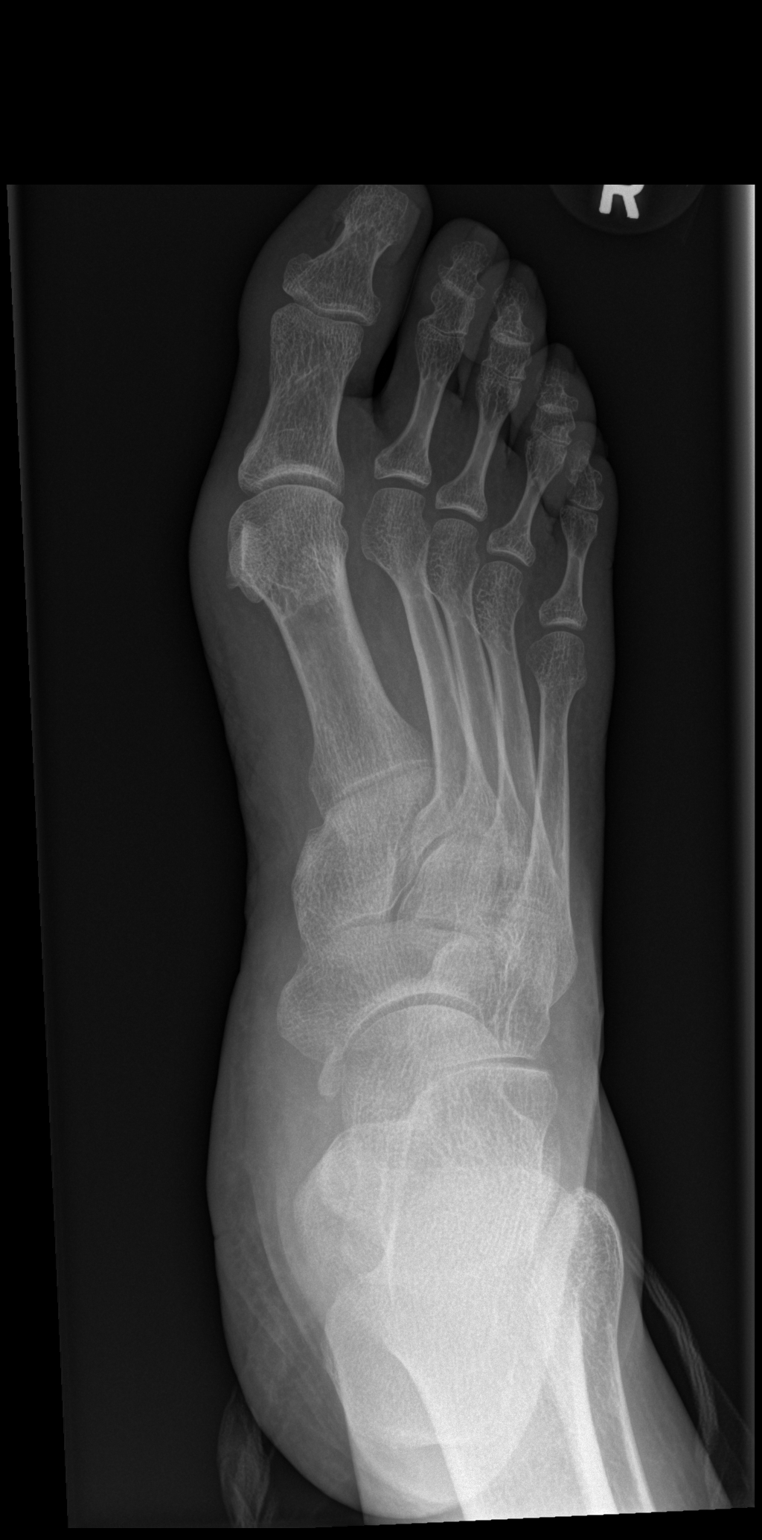

[x foot obl right]
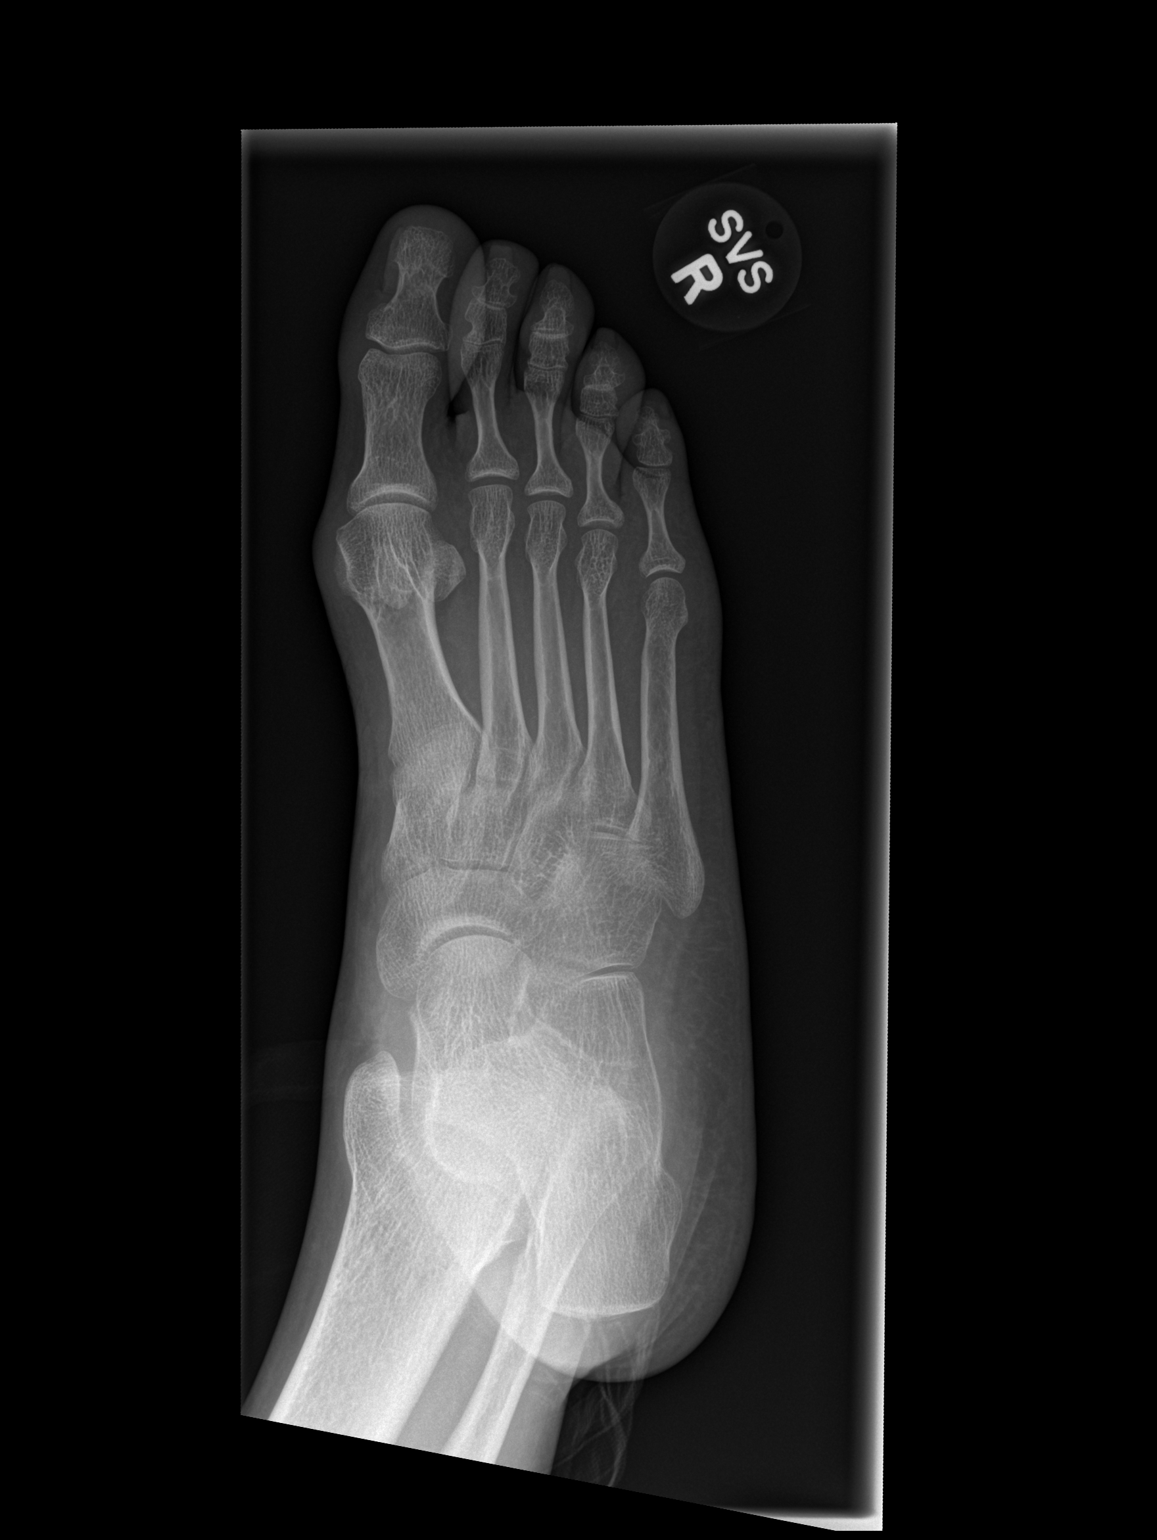

[x foot lat right]
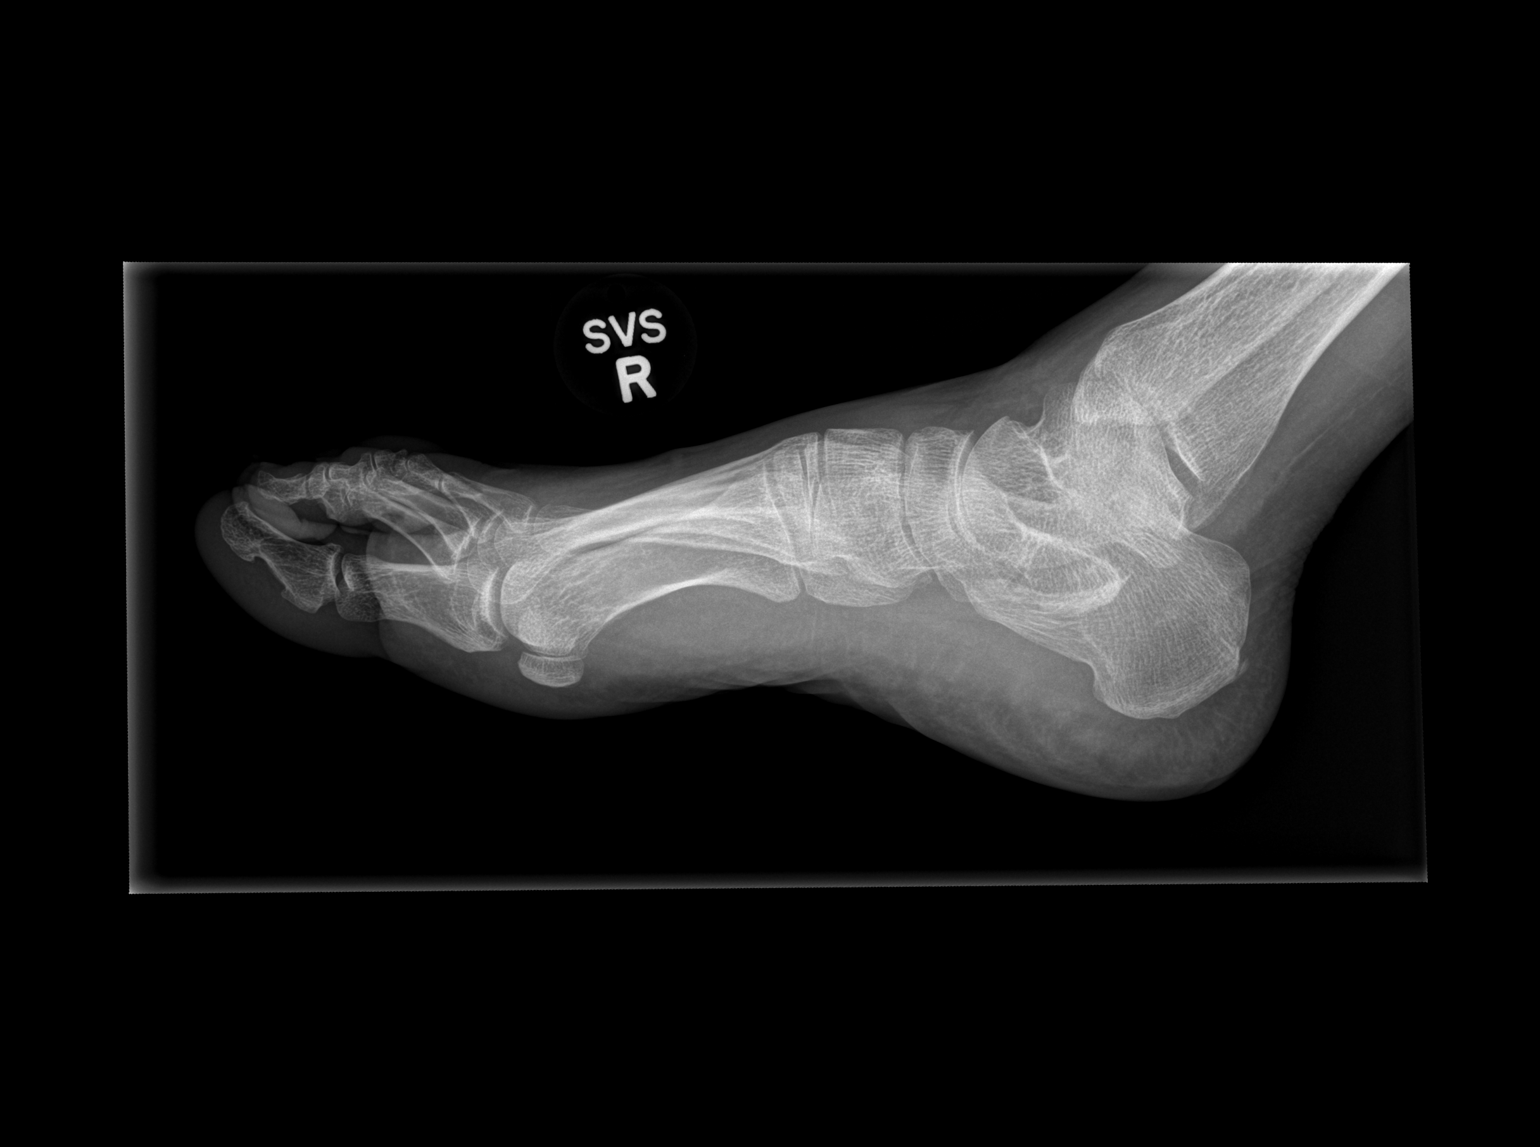

[3 of 3 positions shown; findings below may reference images not displayed]

FINDINGS: No acute bony abnormality.  Specifically, no fracture,
subluxation, or dislocation.  Soft tissues are intact.
IMPRESSION: Normal study.

## 2013-12-23 ENCOUNTER — Ambulatory Visit (INDEPENDENT_AMBULATORY_CARE_PROVIDER_SITE_OTHER): Payer: PRIVATE HEALTH INSURANCE | Admitting: Cardiovascular Disease

## 2013-12-23 ENCOUNTER — Encounter: Payer: Self-pay | Admitting: Cardiovascular Disease

## 2013-12-23 VITALS — BP 152/68 | HR 67 | Resp 16 | Ht 67.0 in | Wt 126.7 lb

## 2013-12-23 DIAGNOSIS — F17200 Nicotine dependence, unspecified, uncomplicated: Secondary | ICD-10-CM

## 2013-12-23 DIAGNOSIS — Q231 Congenital insufficiency of aortic valve: Secondary | ICD-10-CM

## 2013-12-23 DIAGNOSIS — I10 Essential (primary) hypertension: Secondary | ICD-10-CM

## 2013-12-23 DIAGNOSIS — I359 Nonrheumatic aortic valve disorder, unspecified: Secondary | ICD-10-CM

## 2013-12-23 DIAGNOSIS — I351 Nonrheumatic aortic (valve) insufficiency: Secondary | ICD-10-CM

## 2013-12-23 DIAGNOSIS — Z720 Tobacco use: Secondary | ICD-10-CM

## 2013-12-23 NOTE — Patient Instructions (Signed)
Your physician has requested that you have an echocardiogram. Echocardiography is a painless test that uses sound waves to create images of your heart. It provides your doctor with information about the size and shape of your heart and how well your heart's chambers and valves are working. This procedure takes approximately one hour. There are no restrictions for this procedure.  Your physician recommends that you schedule a follow-up appointment in:ONE YEAR with Dr.Croitoru

## 2013-12-23 NOTE — Progress Notes (Signed)
Patient ID: Valerie Murray, female   DOB: 1956/02/17, 58 y.o.   MRN: [id number]     Reason for office visit Aortic insufficiency  Valerie Murray is a 58 year old woman with severe aortic insufficiency related to a bicuspid aortic valve with normal left ventricular size and systolic function. She continues to smoke cigarettes, albeit only one pack every 3-4 days. She has treated systemic hypertension. Previous echocardiography at one point raised the concern for severe pulmonary hypertension, but her last echocardiogram in 2014 showed minimally elevated systolic PA pressure of around 39 mm Hg. She denies shortness of breath at rest or with exertion. She has not had palpitations, syncope or exertional chest pain. Her only complaints are menopause related hot flashes and sweats. She bears diagnoses of fibromyalgia and irritable bowel syndrome. Does not have known coronary disease.  No Known Allergies  Current Outpatient Prescriptions  Medication Sig Dispense Refill  . Calcium Carbonate-Vitamin D (CALCIUM + D PO) Take 1 tablet by mouth once a week.      Marland Kitchen. HYDROcodone-acetaminophen (NORCO) 10-325 MG per tablet Take by mouth as needed.      Marland Kitchen. olmesartan (BENICAR) 40 MG tablet Take 40 mg by mouth daily.       No current facility-administered medications for this visit.    Past Medical History  Diagnosis Date  . Heart murmur   . Arthritis   . Fibromyalgia   . Aortic valve disease     severe AI  . Systemic hypertension   . Tobacco abuse     Past Surgical History  Procedure Laterality Date  . Shoulder surgery    . Cholecystectomy    . [place] echocardiography  12/01/2011    mod.to severe AI,mild valvular aortic stenosis,LA mod. dilated,trace MR,mild to mod. TR,severe pulmonary hypertension,trace PI  . Nm myocar perf wall motion  12/14/2001    anterior wall ischemia  . Cardiac catheterization  12/21/2001    moderately severe aortic regurg.,normal patent coronary arteries    Family History    Problem Relation Age of Onset  . Hypertension Other   . Stroke Mother   . Heart attack Father   . Cancer Sister     breast    History   Social History  . Marital Status: Married    Spouse Name: N/A    Number of Children: N/A  . Years of Education: N/A   Occupational History  . Not on file.   Social History Main Topics  . Smoking status: Current Every Day Smoker -- 0.25 packs/day    Types: Cigarettes  . Smokeless tobacco: Not on file  . Alcohol Use: Yes     Comment: occas.  . Drug Use: No  . Sexual Activity: Not on file   Other Topics Concern  . Not on file   Social History Narrative  . No narrative on file    Review of systems: The patient specifically denies any chest pain at rest or with exertion, dyspnea at rest or with exertion, orthopnea, paroxysmal nocturnal dyspnea, syncope, palpitations, focal neurological deficits, intermittent claudication, lower extremity edema, unexplained weight gain, cough, hemoptysis or wheezing.  The patient also denies abdominal pain, nausea, vomiting, dysphagia, diarrhea, constipation, polyuria, polydipsia, dysuria, hematuria, frequency, urgency, abnormal bleeding or bruising, fever, chills, unexpected weight changes, mood swings, change in skin or hair texture, change in voice quality, auditory or visual problems, allergic reactions or rashes, new musculoskeletal complaints other than usual "aches and pains".   PHYSICAL EXAM BP 152/68  Pulse 67  Ht 5\' 7"  (1.702 m)  Wt 126 lb 11.2 oz (57.471 kg)  BMI 19.84 kg/m2 Recheck 140/60 mm Hg General: Alert, oriented x3, no distress Head: no evidence of trauma, PERRL, EOMI, no exophtalmos or lid lag, no myxedema, no xanthelasma; normal ears, nose and oropharynx Neck: normal jugular venous pulsations and no hepatojugular reflux; brisk carotid pulses without delay and loud bilateral carotid bruits, radiating from chest. Chest: clear to auscultation, no signs of consolidation by percussion or  palpation, normal fremitus, symmetrical and full respiratory excursions Cardiovascular: normal position and hyperdynamic apical impulse, regular rhythm, normal first and second heart sounds, no rubs or gallops. Remarkably mild 4/6 diastolic decrescendo murmur in the aortic focus radiating broadly in the precordium and even up to her carotids, accompanying grade 3-4/6 systolic ejection murmur that is early peaking Abdomen: no tenderness or distention, no masses by palpation, no abnormal pulsatility or arterial bruits, normal bowel sounds, no hepatosplenomegaly Extremities: no clubbing, cyanosis or edema; her femoral pulse has a "water hammer " quality; 2+ radial, ulnar and brachial pulses bilaterally; 2+ right femoral, posterior tibial and dorsalis pedis pulses; 2+ left femoral, posterior tibial and dorsalis pedis pulses; no subclavian or femoral bruits Neurological: grossly nonfocal   EKG: Normal sinus rhythm, no sign of left ventricular hypertrophy    ASSESSMENT AND PLAN  Valerie Murray has chronic severe aortic insufficiency but currently appears to be asymptomatic. Her last assessment one year ago showed normal left ventricular size and ejection fraction. She is approaching the age at which she could possibly receive a biological prosthesis is that of a mechanical device, thereby avoiding the complications of anticoagulation. As long as she remains asymptomatic, would prefer to delay surgery until that point. Will perform yearly echoes to reevaluate left ventricular size and systolic function, since LV performance deterioration would dictate the need for earlier valve replacement.  Again strongly encouraged to quit smoking altogether. She should accomplish this before she is to undergo sternotomy and valve replacement.  Junious Silk, MD, [facility] [phone] office [phone] pager

## 2013-12-26 ENCOUNTER — Ambulatory Visit (HOSPITAL_COMMUNITY)
Admission: RE | Admit: 2013-12-26 | Discharge: 2013-12-26 | Disposition: A | Discharge status: Home / Self Care | Payer: PRIVATE HEALTH INSURANCE | Source: Ambulatory Visit | Attending: Cardiology | Admitting: Cardiology

## 2013-12-26 DIAGNOSIS — Q231 Congenital insufficiency of aortic valve: Secondary | ICD-10-CM

## 2013-12-26 DIAGNOSIS — I359 Nonrheumatic aortic valve disorder, unspecified: Secondary | ICD-10-CM

## 2013-12-26 DIAGNOSIS — I351 Nonrheumatic aortic (valve) insufficiency: Secondary | ICD-10-CM

## 2013-12-26 NOTE — Progress Notes (Signed)
2D Echocardiogram Complete.  12/26/2013   Owais Pruett, RDCS  

## 2014-01-01 ENCOUNTER — Telehealth: Payer: Self-pay | Admitting: *Deleted

## 2014-01-01 DIAGNOSIS — I359 Nonrheumatic aortic valve disorder, unspecified: Secondary | ICD-10-CM

## 2014-01-01 NOTE — Telephone Encounter (Signed)
Echo results called to patient.  Voiced understanding.  Order placed for repeat echo in one year.

## 2014-01-01 NOTE — Telephone Encounter (Signed)
Message copied by Vita Barley on Wed Jan 01, 2014  3:42 PM ------      Message from: Thurmon Fair      Created: Thu Dec 26, 2013 10:47 PM       Echo similar to last year, reevaluate in 12 months. No need for surgery yet, unless she develops symptoms. Doubt accuracy of PA pressure estimation - will review myself ------

## 2014-11-06 ENCOUNTER — Encounter: Payer: Self-pay | Admitting: Certified Nurse Midwife

## 2014-11-06 ENCOUNTER — Ambulatory Visit (INDEPENDENT_AMBULATORY_CARE_PROVIDER_SITE_OTHER): Payer: Medicaid Other | Admitting: Certified Nurse Midwife

## 2014-11-06 ENCOUNTER — Other Ambulatory Visit: Payer: Self-pay | Admitting: Certified Nurse Midwife

## 2014-11-06 VITALS — BP 122/70 | HR 76 | Temp 97.5°F | Ht 67.0 in | Wt 120.0 lb

## 2014-11-06 DIAGNOSIS — Z01419 Encounter for gynecological examination (general) (routine) without abnormal findings: Secondary | ICD-10-CM

## 2014-11-06 DIAGNOSIS — Z0000 Encounter for general adult medical examination without abnormal findings: Secondary | ICD-10-CM

## 2014-11-06 MED ORDER — VARENICLINE TARTRATE 0.5 MG X 11 & 1 MG X 42 PO MISC
ORAL | Status: DC
Start: 2014-11-06 — End: 2015-01-02

## 2014-11-06 NOTE — Progress Notes (Signed)
Patient ID: Valerie Murray, female   DOB: 1956-03-17, 59 y.o.   MRN: [id number]   Subjective:     Valerie Murray is a 59 y.o. female here for a routine exam.  Current complaints: none.  Greater than 7 years since last period.    Sister had endometrial and BCA, dx around 60 years of age and is living.  Smokes about 5 cigs/day.  Sexually active "in a blue moon".   Dental exam last exam unknown.  Has false teeth but cannot wear them because of bone deformities.  Declines STD blood testing.   Personal health questionnaire:  Is patient Ashkenazi Jewish, have a family history of breast and/or ovarian cancer: yes Is there a family history of uterine cancer diagnosed at age < 80, gastrointestinal cancer, urinary tract cancer, family member who is a Personnel officer syndrome-associated carrier: no Is the patient overweight and hypertensive, family history of diabetes, personal history of gestational diabetes, preeclampsia or PCOS: yes Is patient over 15, have PCOS,  family history of premature CHD under age 19, diabetes, smoke, have hypertension or peripheral artery disease:  yes At any time, has a partner hit, kicked or otherwise hurt or frightened you?: no Over the past 2 weeks, have you felt down, depressed or hopeless?: no Over the past 2 weeks, have you felt little interest or pleasure in doing things?:no   Gynecologic History No LMP recorded. Patient is postmenopausal. Contraception: post menopausal status Last Pap: unknown. Results were: normal according to the patient Last mammogram: unknown. Results were: normal according to the patient.   Obstetric History OB History  No data available    Past Medical History  Diagnosis Date  . Heart murmur   . Arthritis   . Fibromyalgia   . Aortic valve disease     severe AI  . Systemic hypertension   . Tobacco abuse     Past Surgical History  Procedure Laterality Date  . Shoulder surgery    . Cholecystectomy    . US echocardiography  12/01/2011   mod.to severe AI,mild valvular aortic stenosis,LA mod. dilated,trace MR,mild to mod. TR,severe pulmonary hypertension,trace PI  . Nm myocar perf wall motion  12/14/2001    anterior wall ischemia  . Cardiac catheterization  12/21/2001    moderately severe aortic regurg.,normal patent coronary arteries     Current outpatient prescriptions:  .  Calcium Carbonate-Vitamin D (CALCIUM + D PO), Take 1 tablet by mouth once a week., Disp: , Rfl:  .  HYDROcodone-acetaminophen (NORCO) 10-325 MG per tablet, Take by mouth as needed., Disp: , Rfl:  .  olmesartan (BENICAR) 40 MG tablet, Take 40 mg by mouth daily., Disp: , Rfl:  .  varenicline (CHANTIX STARTING MONTH PAK) 0.5 MG X 11 & 1 MG X 42 tablet, Take a 0.5mg  tablet 1X/day for 3 days, increase to twice a day for 4 days, then take 1 mg tablet twice a day., Disp: 53 tablet, Rfl: 0 No Known Allergies  History  Substance Use Topics  . Smoking status: Current Every Day Smoker -- 0.25 packs/day    Types: Cigarettes  . Smokeless tobacco: Current User  . Alcohol Use: 0.6 oz/week    0 Standard drinks or equivalent, 1 Glasses of wine per week     Comment: occas.    Family History  Problem Relation Age of Onset  . Hypertension Other   . Stroke Mother   . Heart attack Father   . Cancer Sister     breast  Review of Systems  Constitutional: negative for fatigue and weight loss Respiratory: negative for cough and wheezing Cardiovascular: negative for chest pain, fatigue and palpitations, does have mitral valve insufficiency Gastrointestinal: negative for abdominal pain and change in bowel habits Musculoskeletal:negative for myalgias Neurological: negative for gait problems and tremors Behavioral/Psych: negative for abusive relationship, depression Endocrine: negative for temperature intolerance   Genitourinary:negative for abnormal menstrual periods, genital lesions, hot flashes, sexual problems and vaginal discharge Integument/breast: negative  for breast lump, breast tenderness, nipple discharge and skin lesion(s)    Objective:       BP 122/70 mmHg  Pulse 76  Temp(Src) 97.5 F (36.4 C)  Ht 5\' 7"  (1.702 m)  Wt 120 lb (54.432 kg)  BMI 18.79 kg/m2 General:   alert  Skin:   no rash or abnormalities  Lungs:   clear to auscultation bilaterally  Heart:   regular rate + murmur, click, rub or gallop, S1&S2 present  Breasts:   normal without suspicious masses, skin or nipple changes or axillary nodes  Abdomen:  normal findings: no organomegaly, soft, non-tender and no hernia  Pelvis:  External genitalia: normal general appearance Urinary system: urethral meatus normal and bladder without fullness, nontender Vaginal: normal without tenderness, induration or masses Cervix: normal appearance Adnexa: normal bimanual exam Uterus: anteverted and non-tender, normal size   Lab Review Urine pregnancy test Labs reviewed yes Radiologic studies reviewed no  50% of 30 min visit spent on counseling and coordination of care.   Assessment:    Healthy female exam.   Smoking cessation   Plan:    Education reviewed: calcium supplements, depression evaluation, low fat, low cholesterol diet, safe sex/STD prevention, self breast exams, skin cancer screening, smoking cessation and weight bearing exercise. Contraception: post menopausal status. Mammogram ordered. Follow up in: 1 year.   Meds ordered this encounter  Medications  . varenicline (CHANTIX STARTING MONTH PAK) 0.5 MG X 11 & 1 MG X 42 tablet    Sig: Take a 0.5mg  tablet 1X/day for 3 days, increase to twice a day for 4 days, then take 1 mg tablet twice a day.    Dispense:  53 tablet    Refill:  0   Orders Placed This Encounter  Procedures  . Candidiasis, PCR  . Bacterial Vaginosis DNA  . MM DIGITAL SCREENING BILATERAL    Standing Status: Future     Number of Occurrences:      Standing Expiration Date: 01/06/2016    Order Specific Question:  Reason for Exam (SYMPTOM  OR  DIAGNOSIS REQUIRED)    Answer:  annual exam    Order Specific Question:  Is the patient pregnant?    Answer:  No    Order Specific Question:  Preferred imaging location?    [facility]

## 2014-11-07 ENCOUNTER — Other Ambulatory Visit: Payer: Self-pay | Admitting: Certified Nurse Midwife

## 2014-11-07 DIAGNOSIS — N760 Acute vaginitis: Principal | ICD-10-CM

## 2014-11-07 DIAGNOSIS — B9689 Other specified bacterial agents as the cause of diseases classified elsewhere: Secondary | ICD-10-CM

## 2014-11-07 LAB — PAP IG (IMAGE GUIDED)

## 2014-11-07 MED ORDER — TINIDAZOLE 500 MG PO TABS
2.0000 g | ORAL_TABLET | Freq: Every day | ORAL | Status: AC
Start: 2014-11-07 — End: 2014-11-09

## 2014-11-10 LAB — BACTERIAL VAGINOSIS DNA
ATOPOBIUM VAGINAE: 7.3 Log (cells/mL)
Gardnerella vaginalis: 7.7 Log (cells/mL)
LACTOBACILLUS SPECIES: NOT DETECTED Log (cells/mL)
MEGASPHAERA SPECIES: 7.8 Log (cells/mL)

## 2014-11-10 LAB — CANDIDIASIS, PCR
C. PARAPSILOSIS, DNA: NOT DETECTED
C. TROPICALIS, DNA: NOT DETECTED
C. albicans, DNA: NOT DETECTED
C. glabrata, DNA: NOT DETECTED

## 2014-11-12 ENCOUNTER — Other Ambulatory Visit: Payer: Self-pay | Admitting: Certified Nurse Midwife

## 2014-11-12 DIAGNOSIS — N760 Acute vaginitis: Principal | ICD-10-CM

## 2014-11-12 DIAGNOSIS — B9689 Other specified bacterial agents as the cause of diseases classified elsewhere: Secondary | ICD-10-CM

## 2014-11-12 MED ORDER — METRONIDAZOLE 500 MG PO TABS: 500.0000 mg | ORAL_TABLET | Freq: Two times a day (BID) | ORAL | Status: DC

## 2015-01-02 ENCOUNTER — Encounter: Payer: Self-pay | Admitting: Cardiovascular Disease

## 2015-01-02 ENCOUNTER — Ambulatory Visit (INDEPENDENT_AMBULATORY_CARE_PROVIDER_SITE_OTHER): Payer: Medicare Other | Admitting: Cardiovascular Disease

## 2015-01-02 VITALS — BP 136/54 | HR 81 | Resp 16 | Ht 67.0 in | Wt 122.0 lb

## 2015-01-02 DIAGNOSIS — I10 Essential (primary) hypertension: Secondary | ICD-10-CM

## 2015-01-02 DIAGNOSIS — I351 Nonrheumatic aortic (valve) insufficiency: Secondary | ICD-10-CM

## 2015-01-02 DIAGNOSIS — Q231 Congenital insufficiency of aortic valve: Secondary | ICD-10-CM | POA: Diagnosis not present

## 2015-01-02 DIAGNOSIS — Z720 Tobacco use: Secondary | ICD-10-CM | POA: Diagnosis not present

## 2015-01-02 NOTE — Progress Notes (Signed)
Patient ID: Valerie Murray, female   DOB: 1955/09/19, 59 y.o.   MRN: [id number]     Cardiology Office Note   Date:  01/02/2015   ID:  Valerie Murray, DOB 1955-05-17, MRN [id number]  PCP:  Pcp Not In System  Cardiologist:   Thurmon Fair, MD   Chief Complaint  Patient presents with  . Annual Exam    Patient has no complaints.      History of Present Illness: Valerie Murray is a 59 y.o. female who presents for  A bicuspid aortic valve with severe aortic insufficiency. Her last echocardiogram a year ago showed hyperdynamic left ventricular systolic function, normal left ventricular size. She was supposed to have an echo just before today's appointment, but has not yet had it done. He is completely asymptomatic without any complaints of exertional dyspnea, angina, syncope or palpitations or lower extremity edema. She had chronic diarrhea attributed to Benicar and is doing better after being switched to a [employer].    Past Medical History  Diagnosis Date  . Heart murmur   . Arthritis   . Fibromyalgia   . Aortic valve disease     severe AI  . Systemic hypertension   . Tobacco abuse     Past Surgical History  Procedure Laterality Date  . Shoulder surgery    . Cholecystectomy    . US echocardiography  12/01/2011    mod.to severe AI,mild valvular aortic stenosis,LA mod. dilated,trace MR,mild to mod. TR,severe pulmonary hypertension,trace PI  . Nm myocar perf wall motion  12/14/2001    anterior wall ischemia  . Cardiac catheterization  12/21/2001    moderately severe aortic regurg.,normal patent coronary arteries     Current Outpatient Prescriptions  Medication Sig Dispense Refill  . Calcium Carbonate-Vitamin D (CALCIUM + D PO) Take 1 tablet by mouth once a week.    Marland Kitchen HYDROcodone-acetaminophen (NORCO) 10-325 MG per tablet Take by mouth as needed.     No current facility-administered medications for this visit.    Allergies:   Review of patient's allergies indicates no  known allergies.    Social History:  The patient  reports that she has been smoking Cigarettes.  She has been smoking about 0.25 packs per day. She uses smokeless tobacco. She reports that she drinks about 0.6 oz of alcohol per week. She reports that she does not use illicit drugs.   Family History:  The patient's family history includes Cancer in her sister; Heart attack in her father; Hypertension in her other; Stroke in her mother.    ROS:  Please see the history of present illness.    Otherwise, review of systems positive for none.   All other systems are reviewed and negative.    PHYSICAL EXAM: VS:  BP 136/54 mmHg  Pulse 81  Resp 16  Ht  (1.702 m)  Wt 122 lb (55.339 kg)  BMI 19.10 kg/m2 , BMI Body mass index is 19.1 kg/(m^2).  General: Alert, oriented x3, no distress Head: no evidence of trauma, PERRL, EOMI, no exophtalmos or lid lag, no myxedema, no xanthelasma; normal ears, nose and oropharynx Neck: normal jugular venous pulsations and no hepatojugular reflux; brisk carotid pulses without delay and loud bilateral carotid bruits, radiating from chest. Chest: clear to auscultation, no signs of consolidation by percussion or palpation, normal fremitus, symmetrical and full respiratory excursions Cardiovascular: normal position and hyperdynamic apical impulse, regular rhythm, normal first and second heart sounds, no rubs or gallops. Remarkably mild 4/6 diastolic decrescendo murmur in the  aortic focus radiating broadly in the precordium and even up to her carotids, accompanying grade 3-4/6 systolic ejection murmur that is early peaking Abdomen: no tenderness or distention, no masses by palpation, no abnormal pulsatility or arterial bruits, normal bowel sounds, no hepatosplenomegaly Extremities: no clubbing, cyanosis or edema; her femoral pulse has a "water hammer " quality; 2+ radial, ulnar and brachial pulses bilaterally; 2+ right femoral, posterior tibial and dorsalis pedis  pulses; 2+ left femoral, posterior tibial and dorsalis pedis pulses; no subclavian or femoral bruits Neurological: grossly nonfocal   EKG:  EKG is not ordered today  Recent Labs: No results found for requested labs within last 365 days.    Lipid Panel No results found for: CHOL, TRIG, HDL, CHOLHDL, VLDL, LDLCALC, LDLDIRECT    Wt Readings from Last 3 Encounters:  01/02/15 122 lb (55.339 kg)  11/06/14 120 lb (54.432 kg)  12/23/13 126 lb 11.2 oz (57.471 kg)      ASSESSMENT AND PLAN:  Despite severe aortic insufficiency Valerie Murray remains completely asymptomatic. Need to monitor the ventricular size and systolic function at least yearly. As long as these also remain normal we can keep delaying the need for aortic valve surgery. Would continue trying to encourage smoking cessation insistently, since she is likely to do much better with open heart surgery if she has quit smoking.. Will call her back with the results of an echocardiogram.    Current medicines are reviewed at length with the patient today.  The patient does not have concerns regarding medicines.  The following changes have been made:  no change  Labs/ tests ordered today include:  Orders Placed This Encounter  Procedures  . ECHOCARDIOGRAM COMPLETE    Patient Instructions  Your physician has requested that you have an echocardiogram. Echocardiography is a painless test that uses sound waves to create images of your heart. It provides your doctor with information about the size and shape of your heart and how well your heart's chambers and valves are working. This procedure takes approximately one hour. There are no restrictions for this procedure.  Dr. Royann Shivers recommends that you schedule a follow-up appointment in: one year.       Joie Bimler, MD  01/02/2015 5:33 PM    Thurmon Fair, MD, [facility] [phone] office [phone] pager

## 2015-01-02 NOTE — Patient Instructions (Signed)
Your physician has requested that you have an echocardiogram. Echocardiography is a painless test that uses sound waves to create images of your heart. It provides your doctor with information about the size and shape of your heart and how well your heart's chambers and valves are working. This procedure takes approximately one hour. There are no restrictions for this procedure.  Dr. Croitoru recommends that you schedule a follow-up appointment in: one year   

## 2015-01-15 ENCOUNTER — Ambulatory Visit (HOSPITAL_COMMUNITY): Payer: Medicare Other | Attending: Cardiovascular Disease

## 2015-01-15 ENCOUNTER — Other Ambulatory Visit: Payer: Self-pay

## 2015-01-15 DIAGNOSIS — Z8249 Family history of ischemic heart disease and other diseases of the circulatory system: Secondary | ICD-10-CM | POA: Insufficient documentation

## 2015-01-15 DIAGNOSIS — I517 Cardiomegaly: Secondary | ICD-10-CM | POA: Insufficient documentation

## 2015-01-15 DIAGNOSIS — I358 Other nonrheumatic aortic valve disorders: Secondary | ICD-10-CM | POA: Diagnosis present

## 2015-01-15 DIAGNOSIS — I351 Nonrheumatic aortic (valve) insufficiency: Secondary | ICD-10-CM | POA: Insufficient documentation

## 2015-01-15 DIAGNOSIS — I10 Essential (primary) hypertension: Secondary | ICD-10-CM | POA: Diagnosis not present

## 2015-01-15 DIAGNOSIS — F17200 Nicotine dependence, unspecified, uncomplicated: Secondary | ICD-10-CM | POA: Diagnosis not present

## 2015-01-16 ENCOUNTER — Telehealth: Payer: Self-pay | Admitting: *Deleted

## 2015-01-16 DIAGNOSIS — I351 Nonrheumatic aortic (valve) insufficiency: Secondary | ICD-10-CM

## 2015-01-16 NOTE — Telephone Encounter (Signed)
-----   Message from Thurmon Fair, MD sent at 01/15/2015  4:05 PM EDT ----- Aortic valve leak is severe, as before. The report states that the LV is severely dilated, but all the measurements of LV size are well within normal range. Will review the images myself. Schedule for repeat echo before her next visit in one year.

## 2015-01-16 NOTE — Telephone Encounter (Signed)
Patient notified of echo results.  Will repeat in one year prior to next visit.  Patient voiced understanding.

## 2016-01-20 ENCOUNTER — Ambulatory Visit (INDEPENDENT_AMBULATORY_CARE_PROVIDER_SITE_OTHER): Payer: Medicare Other | Admitting: Cardiovascular Disease

## 2016-01-20 ENCOUNTER — Encounter: Payer: Self-pay | Admitting: Cardiovascular Disease

## 2016-01-20 VITALS — BP 148/63 | HR 67 | Ht 67.0 in | Wt 121.2 lb

## 2016-01-20 DIAGNOSIS — Z720 Tobacco use: Secondary | ICD-10-CM | POA: Diagnosis not present

## 2016-01-20 DIAGNOSIS — I10 Essential (primary) hypertension: Secondary | ICD-10-CM | POA: Diagnosis not present

## 2016-01-20 DIAGNOSIS — I351 Nonrheumatic aortic (valve) insufficiency: Secondary | ICD-10-CM | POA: Diagnosis not present

## 2016-01-20 MED ORDER — AMLODIPINE BESYLATE 2.5 MG PO TABS: 2.5000 mg | ORAL_TABLET | Freq: Every day | ORAL | 3 refills | Status: DC

## 2016-01-20 NOTE — Patient Instructions (Signed)
Medication Instructions: Dr Royann Shiversroitoru has recommended making the following medication changes: 1. START Amlodipine 2.5 mg - take 1 tablet by mouth daily  Labwork: NONE ORDERED  Testing/Procedures: 1. Echocardiogram NOW and AGAIN IN 1 YEAR - Your physician has requested that you have an echocardiogram. Echocardiography is a painless test that uses sound waves to create images of your heart. It provides your doctor with information about the size and shape of your heart and how well your heart's chambers and valves are working. This procedure takes approximately one hour. There are no restrictions for this procedure. [facility] - [address], [address].  Follow-up: Dr Royann Shiversroitoru recommends that you schedule a follow-up appointment in 12 months. You will receive a reminder letter in the mail two months in advance. If you don't receive a letter, please call our office to schedule the follow-up appointment.  If you need a refill on your cardiac medications before your next appointment, please call your pharmacy.

## 2016-01-20 NOTE — Progress Notes (Signed)
Cardiology Office Note    Date:  01/21/2016   ID:  Valerie Murray, DOB 07-Feb-1956, MRN [id number]  PCP:  Pcp Not In System  Cardiologist:   Thurmon FairMihai Janitza Revuelta, MD   Chief Complaint  Patient presents with  . Follow-up    annual    History of Present Illness:  Valerie Murray is a 60 y.o. female with a bicuspid aortic valve and severe aortic insufficiency, but with preserved left ventricular size and systolic function and without significant dilation of the aortic root or known coarctation. She has hypertension that by her report has been borderline controlled with her typical systolic blood pressure around 150. She has a very wide pulse pressure, with systolic blood pressure around 60.  Unfortunate she continues to smoke. She reports no problems with palpitations, dyspnea, angina, syncope, leg edema, focal neurological complaints, syncope, cough, intolerance to heat or cold or major weight changes.  Her last echocardiogram was performed approximately 1 year ago.    Past Medical History:  Diagnosis Date  . Aortic valve disease    severe AI  . Arthritis   . Fibromyalgia   . Heart murmur   . Systemic hypertension   . Tobacco abuse     Past Surgical History:  Procedure Laterality Date  . CARDIAC CATHETERIZATION  12/21/2001   moderately severe aortic regurg.,normal patent coronary arteries  . CHOLECYSTECTOMY    . NM MYOCAR PERF WALL MOTION  12/14/2001   anterior wall ischemia  . SHOULDER SURGERY    . US ECHOCARDIOGRAPHY  12/01/2011   mod.to severe AI,mild valvular aortic stenosis,LA mod. dilated,trace MR,mild to mod. TR,severe pulmonary hypertension,trace PI    Current Medications: Outpatient Medications Prior to Visit  Medication Sig Dispense Refill  . Calcium Carbonate-Vitamin D (CALCIUM + D PO) Take 1 tablet by mouth once a week.    Marland Kitchen. HYDROcodone-acetaminophen (NORCO) 10-325 MG per tablet Take by mouth as needed.     No facility-administered medications prior to visit.       Allergies:   Review of patient's allergies indicates no known allergies.   Social History   Social History  . Marital status: Married    Spouse name: N/A  . Number of children: N/A  . Years of education: N/A   Social History Main Topics  . Smoking status: Current Every Day Smoker    Packs/day: 0.25    Types: Cigarettes  . Smokeless tobacco: Current User  . Alcohol use 0.6 oz/week    1 Glasses of wine per week     Comment: occas.  . Drug use: No  . Sexual activity: Yes    Partners: Male    Birth control/ protection: None   Other Topics Concern  . None   Social History Narrative  . None     Family History:  The patient's family history includes Cancer in her sister; Heart attack in her father; Hypertension in her other; Stroke in her mother.   ROS:   Please see the history of present illness.    ROS All other systems reviewed and are negative.   PHYSICAL EXAM:   VS:  BP (!) 148/63   Pulse 67   Ht 5\' 7"  (1.702 m)   Wt 121 lb 3.2 oz (55 kg)   BMI 18.98 kg/m    GEN: Well nourished, well developed, in no acute distress  HEENT: normal  Neck: no JVD, carotid bruits, or masses Cardiac: RRR; Systolic click, 3/6 early peaking systolic ejection murmur and 3/6 decrescendo diastolic  aortic murmur at the left lower sternal border and aortic focus, rubs, or gallops,no edema. Hyperdynamic peripheral pulses  Respiratory:  clear to auscultation bilaterally, normal work of breathing GI: soft, nontender, nondistended, + BS MS: no deformity or atrophy  Skin: warm and dry, no rash Neuro:  Alert and Oriented x 3, Strength and sensation are intact Psych: euthymic mood, full affect  Wt Readings from Last 3 Encounters:  01/20/16 121 lb 3.2 oz (55 kg)  01/02/15 122 lb (55.3 kg)  11/06/14 120 lb (54.4 kg)      Studies/Labs Reviewed:   EKG:  EKG is ordered today.  The ekg ordered today demonstrates NSR, normal.    ASSESSMENT:    1. Severe aortic insufficiency       PLAN:  In order of problems listed above:  1. AI/bicuspid aortic valve: Check echocardiogram. Asked her to promptly report exertional dyspnea or angina, irregular heart rhythm, leg edema. Reviewed the need for surgical intervention if she becomes symptomatic or if there is any sign of left ventricular enlargement or dysfunction. 2. HTN: Add amlodipine. She has a follow-up visit with her primary care provider and roughly 1 month when further amlodipine dose titration can be performed if necessary. 3. Smoking cessation was strongly advised. It is almost certain that she will eventually need aortic valve replacement/open heart surgery. Ideally her lungs will be in the best possible shape at that time if she quits smoking.    Medication Adjustments/Labs and Tests Ordered: Current medicines are reviewed at length with the patient today.  Concerns regarding medicines are outlined above.  Medication changes, Labs and Tests ordered today are listed in the Patient Instructions below. Patient Instructions  Medication Instructions: Dr Royann Shivers has recommended making the following medication changes: 1. START Amlodipine 2.5 mg - take 1 tablet by mouth daily  Labwork: NONE ORDERED  Testing/Procedures: 1. Echocardiogram NOW and AGAIN IN 1 YEAR - Your physician has requested that you have an echocardiogram. Echocardiography is a painless test that uses sound waves to create images of your heart. It provides your doctor with information about the size and shape of your heart and how well your heart's chambers and valves are working. This procedure takes approximately one hour. There are no restrictions for this procedure. This will be performed at our [facility] location - [address], [address].  Follow-up: Dr Royann Shivers recommends that you schedule a follow-up appointment in 12 months. You will receive a reminder letter in the mail two months in advance. If you don't receive a letter, please call  our office to schedule the follow-up appointment.  If you need a refill on your cardiac medications before your next appointment, please call your pharmacy.    Signed, Thurmon Fair, MD  01/21/2016 10:54 AM    [facility] [address] [place], [place], [place]  [address] Phone: [phone]; Fax: [phone]

## 2016-02-02 ENCOUNTER — Other Ambulatory Visit: Payer: Self-pay

## 2016-02-02 ENCOUNTER — Ambulatory Visit (HOSPITAL_COMMUNITY): Payer: Medicare Other | Attending: Cardiology

## 2016-02-02 DIAGNOSIS — I071 Rheumatic tricuspid insufficiency: Secondary | ICD-10-CM | POA: Diagnosis not present

## 2016-02-02 DIAGNOSIS — I352 Nonrheumatic aortic (valve) stenosis with insufficiency: Secondary | ICD-10-CM | POA: Diagnosis not present

## 2016-02-02 DIAGNOSIS — Z720 Tobacco use: Secondary | ICD-10-CM | POA: Diagnosis not present

## 2016-02-02 DIAGNOSIS — I371 Nonrheumatic pulmonary valve insufficiency: Secondary | ICD-10-CM | POA: Insufficient documentation

## 2016-02-02 DIAGNOSIS — I351 Nonrheumatic aortic (valve) insufficiency: Secondary | ICD-10-CM

## 2016-02-02 DIAGNOSIS — I340 Nonrheumatic mitral (valve) insufficiency: Secondary | ICD-10-CM | POA: Insufficient documentation

## 2016-02-02 DIAGNOSIS — I10 Essential (primary) hypertension: Secondary | ICD-10-CM | POA: Insufficient documentation

## 2016-02-02 DIAGNOSIS — I359 Nonrheumatic aortic valve disorder, unspecified: Secondary | ICD-10-CM | POA: Diagnosis present

## 2016-02-04 ENCOUNTER — Telehealth: Payer: Self-pay | Admitting: Cardiovascular Disease

## 2016-02-04 DIAGNOSIS — I351 Nonrheumatic aortic (valve) insufficiency: Secondary | ICD-10-CM

## 2016-02-04 NOTE — Telephone Encounter (Signed)
No answer. Left message to call back.   

## 2016-02-04 NOTE — Telephone Encounter (Signed)
Returning call,she does not know who called. °

## 2016-02-09 NOTE — Telephone Encounter (Signed)
lmtcb

## 2016-02-15 DIAGNOSIS — Z1231 Encounter for screening mammogram for malignant neoplasm of breast: Secondary | ICD-10-CM | POA: Diagnosis not present

## 2016-02-15 DIAGNOSIS — Z803 Family history of malignant neoplasm of breast: Secondary | ICD-10-CM | POA: Diagnosis not present

## 2016-02-19 NOTE — Telephone Encounter (Signed)
-----   Message from Thurmon FairMihai Croitoru, MD sent at 02/03/2016  8:15 AM EDT ----- The valve is still leaking severely, but no sign of bad effects on the ventricle. Repeat in one year. Call sooner if she develops shortness of breath, swelling, chest pain or blackout spells

## 2016-02-19 NOTE — Telephone Encounter (Signed)
Called patient with results. Patient verbalized understanding. Order placed for repeat echo in 1 year at patient's office visit in September.

## 2016-02-24 ENCOUNTER — Encounter: Payer: Self-pay | Admitting: Certified Nurse Midwife

## 2016-02-24 ENCOUNTER — Ambulatory Visit (INDEPENDENT_AMBULATORY_CARE_PROVIDER_SITE_OTHER): Payer: Medicare Other | Admitting: Certified Nurse Midwife

## 2016-02-24 ENCOUNTER — Other Ambulatory Visit (HOSPITAL_COMMUNITY)
Admission: RE | Admit: 2016-02-24 | Discharge: 2016-02-24 | Disposition: A | Discharge status: Home / Self Care | Payer: Medicare Other | Source: Ambulatory Visit | Attending: Certified Nurse Midwife | Admitting: Certified Nurse Midwife

## 2016-02-24 VITALS — BP 146/74 | HR 65 | Temp 97.0°F | Ht 67.0 in | Wt 120.0 lb

## 2016-02-24 DIAGNOSIS — Z01419 Encounter for gynecological examination (general) (routine) without abnormal findings: Secondary | ICD-10-CM | POA: Diagnosis not present

## 2016-02-24 DIAGNOSIS — Z124 Encounter for screening for malignant neoplasm of cervix: Secondary | ICD-10-CM

## 2016-02-24 DIAGNOSIS — Z1151 Encounter for screening for human papillomavirus (HPV): Secondary | ICD-10-CM | POA: Insufficient documentation

## 2016-02-24 NOTE — Progress Notes (Signed)
Subjective:     Valerie HaverHarriet Murray is a 60 y.o. female here for a routine exam.  Current complaints: none. Current complaints: none.  Greater than 7 years since last period.    Sister had endometrial and BCA, dx around 61 years of age and is living.  Smokes about 5 cigs/day.  Sexually active "in a blue moon".   Dental exam last exam unknown.  Has false teeth but cannot wear them because of bone deformities.  Declines STD blood testing.    Personal health questionnaire:  Is patient Ashkenazi Jewish, have a family history of breast and/or ovarian cancer: no Is there a family history of uterine cancer diagnosed at age < 1450, gastrointestinal cancer, urinary tract cancer, family member who is a Personnel officerLynch syndrome-associated carrier: no Is the patient overweight and hypertensive, family history of diabetes, personal history of gestational diabetes, preeclampsia or PCOS: yes Is patient over 2555, have PCOS,  family history of premature CHD under age 61, diabetes, smoke, have hypertension or peripheral artery disease:  yes At any time, has a partner hit, kicked or otherwise hurt or frightened you?: no Over the past 2 weeks, have you felt down, depressed or hopeless?: no Over the past 2 weeks, have you felt little interest or pleasure in doing things?:no   Gynecologic History No LMP recorded (lmp unknown). Patient is postmenopausal. Contraception: post menopausal status Last Pap: 11/06/14. Results were: ASCUS Last mammogram: 2016. [facility]  Obstetric History OB History  No data available    Past Medical History:  Diagnosis Date  . Aortic valve disease    severe AI  . Arthritis   . Fibromyalgia   . Heart murmur   . Systemic hypertension   . Tobacco abuse     Past Surgical History:  Procedure Laterality Date  . CARDIAC CATHETERIZATION  12/21/2001   moderately severe aortic regurg.,normal patent coronary arteries  . CHOLECYSTECTOMY    . NM MYOCAR PERF WALL MOTION  12/14/2001    anterior wall ischemia  . SHOULDER SURGERY    . US ECHOCARDIOGRAPHY  12/01/2011   mod.to severe AI,mild valvular aortic stenosis,LA mod. dilated,trace MR,mild to mod. TR,severe pulmonary hypertension,trace PI     Current Outpatient Prescriptions:  .  amLODipine (NORVASC) 2.5 MG tablet, Take 1 tablet (2.5 mg total) by mouth daily., Disp: 180 tablet, Rfl: 3 .  Calcium Carbonate-Vitamin D (CALCIUM + D PO), Take 1 tablet by mouth once a week., Disp: , Rfl:  .  EDARBYCLOR 40-12.5 MG TABS, , Disp: , Rfl:  .  HYDROcodone-acetaminophen (NORCO) 10-325 MG per tablet, Take by mouth as needed., Disp: , Rfl:  No Known Allergies  Social History  Substance Use Topics  . Smoking status: Current Every Day Smoker    Packs/day: 0.25    Types: Cigarettes  . Smokeless tobacco: Current User  . Alcohol use 0.6 oz/week    1 Glasses of wine per week     Comment: occas.    Family History  Problem Relation Age of Onset  . Hypertension Other   . Stroke Mother   . Heart attack Father   . Cancer Sister     breast      Review of Systems  Constitutional: negative for fatigue and weight loss Respiratory: negative for cough and wheezing Cardiovascular: negative for chest pain, fatigue and palpitations Gastrointestinal: negative for abdominal pain and change in bowel habits Musculoskeletal:negative for myalgias Neurological: negative for gait problems and tremors Behavioral/Psych: negative for abusive relationship,  depression Endocrine: negative for temperature intolerance   Genitourinary:negative for abnormal menstrual periods, genital lesions, hot flashes, sexual problems and vaginal discharge Integument/breast: negative for breast lump, breast tenderness, nipple discharge and skin lesion(s)    Objective:       BP (!) 146/74   Pulse 65   Temp 97 F (36.1 C)   Ht 5\' 7"  (1.702 m)   Wt 120 lb (54.4 kg)   LMP  (LMP Unknown)   BMI 18.79 kg/m  General:   alert  Skin:   no rash or abnormalities   Lungs:   clear to auscultation bilaterally  Heart:   hx of mitral valve reflux, irregular rate, S1&S2 present  Breasts:   normal without suspicious masses, skin or nipple changes or axillary nodes  Abdomen:  normal findings: no organomegaly, soft, non-tender and no hernia  Pelvis:  External genitalia: normal general appearance Urinary system: urethral meatus normal and bladder without fullness, nontender Vaginal: normal without tenderness, induration or masses Cervix: normal appearance Adnexa: normal bimanual exam Uterus: anteverted and non-tender, normal size   Lab Review Urine pregnancy test Labs reviewed yes Radiologic studies reviewed need records  50% of 30 min visit spent on counseling and coordination of care.   Assessment:    Healthy female exam.    Plan:    Education reviewed: calcium supplements, depression evaluation, low fat, low cholesterol diet, safe sex/STD prevention, self breast exams, skin cancer screening and weight bearing exercise. Contraception: post menopausal status. Follow up in: 1 year.   No orders of the defined types were placed in this encounter.  No orders of the defined types were placed in this encounter.  Need to obtain previous records  Follow up as needed.

## 2016-02-26 LAB — CYTOLOGY - PAP
Adequacy: ABSENT
Diagnosis: NEGATIVE
HPV: DETECTED — AB

## 2016-03-01 ENCOUNTER — Encounter: Payer: Self-pay | Admitting: Certified Nurse Midwife

## 2016-03-08 DIAGNOSIS — H04123 Dry eye syndrome of bilateral lacrimal glands: Secondary | ICD-10-CM | POA: Diagnosis not present

## 2016-03-08 DIAGNOSIS — H524 Presbyopia: Secondary | ICD-10-CM | POA: Diagnosis not present

## 2016-03-08 DIAGNOSIS — H2513 Age-related nuclear cataract, bilateral: Secondary | ICD-10-CM | POA: Diagnosis not present

## 2016-07-15 DIAGNOSIS — K635 Polyp of colon: Secondary | ICD-10-CM | POA: Diagnosis not present

## 2016-11-08 DIAGNOSIS — R194 Change in bowel habit: Secondary | ICD-10-CM | POA: Diagnosis not present

## 2016-11-08 DIAGNOSIS — R1084 Generalized abdominal pain: Secondary | ICD-10-CM | POA: Diagnosis not present

## 2016-11-08 DIAGNOSIS — K5730 Diverticulosis of large intestine without perforation or abscess without bleeding: Secondary | ICD-10-CM | POA: Diagnosis not present

## 2016-11-14 DIAGNOSIS — R194 Change in bowel habit: Secondary | ICD-10-CM | POA: Diagnosis not present

## 2016-11-22 DIAGNOSIS — K5730 Diverticulosis of large intestine without perforation or abscess without bleeding: Secondary | ICD-10-CM | POA: Diagnosis not present

## 2016-11-22 DIAGNOSIS — R197 Diarrhea, unspecified: Secondary | ICD-10-CM | POA: Diagnosis not present

## 2016-12-27 ENCOUNTER — Telehealth: Payer: Self-pay

## 2016-12-27 DIAGNOSIS — I10 Essential (primary) hypertension: Secondary | ICD-10-CM | POA: Diagnosis not present

## 2016-12-27 NOTE — Telephone Encounter (Signed)
Received a call from patient stating she was returning someone's phone call.After reviewing chart unable to tell who called.Stated she was doing well no complaints.Advised to keep appointment with Dr.Croitoru as planned.

## 2017-01-10 DIAGNOSIS — I10 Essential (primary) hypertension: Secondary | ICD-10-CM | POA: Diagnosis not present

## 2017-01-24 DIAGNOSIS — I10 Essential (primary) hypertension: Secondary | ICD-10-CM | POA: Diagnosis not present

## 2017-02-03 ENCOUNTER — Ambulatory Visit (HOSPITAL_COMMUNITY): Payer: Medicare Other | Attending: Cardiovascular Disease

## 2017-02-03 ENCOUNTER — Other Ambulatory Visit: Payer: Self-pay

## 2017-02-03 DIAGNOSIS — Z720 Tobacco use: Secondary | ICD-10-CM | POA: Diagnosis not present

## 2017-02-03 DIAGNOSIS — I080 Rheumatic disorders of both mitral and aortic valves: Secondary | ICD-10-CM | POA: Insufficient documentation

## 2017-02-03 DIAGNOSIS — I351 Nonrheumatic aortic (valve) insufficiency: Secondary | ICD-10-CM | POA: Diagnosis not present

## 2017-02-03 DIAGNOSIS — I359 Nonrheumatic aortic valve disorder, unspecified: Secondary | ICD-10-CM | POA: Diagnosis present

## 2017-02-03 DIAGNOSIS — I119 Hypertensive heart disease without heart failure: Secondary | ICD-10-CM | POA: Insufficient documentation

## 2017-02-14 ENCOUNTER — Encounter: Payer: Self-pay | Admitting: Cardiovascular Disease

## 2017-02-14 ENCOUNTER — Ambulatory Visit (INDEPENDENT_AMBULATORY_CARE_PROVIDER_SITE_OTHER): Payer: Medicare Other | Admitting: Cardiovascular Disease

## 2017-02-14 VITALS — BP 122/54 | HR 64 | Ht 67.0 in | Wt 113.8 lb

## 2017-02-14 DIAGNOSIS — I351 Nonrheumatic aortic (valve) insufficiency: Secondary | ICD-10-CM | POA: Diagnosis not present

## 2017-02-14 DIAGNOSIS — I10 Essential (primary) hypertension: Secondary | ICD-10-CM

## 2017-02-14 DIAGNOSIS — Z720 Tobacco use: Secondary | ICD-10-CM | POA: Diagnosis not present

## 2017-02-14 NOTE — Progress Notes (Signed)
Cardiology Office Note    Date:  02/14/2017   ID:  Valerie Murray, DOB 01-31-1956, MRN [id number]  PCP:  System, Pcp Not In  Cardiologist:   Thurmon Fair, MD   No chief complaint on file.   History of Present Illness:  Valerie Murray is a 60 y.o. female with a bicuspid aortic valve and aortic insufficiency, but with preserved left ventricular size and systolic function and without significant dilation of the aortic root or known coarctation.   Her most recent echocardiogram performed on September 28 shows only moderate aortic regurgitation, mild aortic stenosis, with the leaflets being described a severely thickened and severely calcified. The aorta remains normal in caliber. The valve was described as being trileaflet on this most recent echo, but previous studies have described it as being bicuspid. The left ventricle was described as being dilated, but the end-diastolic diameter is only 47 mm and the end-systolic diameter is only 29 mm, which is normal.  The patient specifically denies any chest pain at rest or with exertion, dyspnea at rest or with exertion, orthopnea, paroxysmal nocturnal dyspnea, syncope, palpitations, focal neurological deficits, intermittent claudication, lower extremity edema, unexplained weight gain, cough, hemoptysis or wheezing.  The patient also denies abdominal pain, nausea, vomiting, dysphagia, diarrhea, constipation, polyuria, polydipsia, dysuria, hematuria, frequency, urgency, abnormal bleeding or bruising, fever, chills, unexpected weight changes, mood swings, change in skin or hair texture, change in voice quality, auditory or visual problems, allergic reactions or rashes, new musculoskeletal complaints other than usual "aches and pains".  Unfortunately she continues to smoke, although she has cut back to a third of pack a day. Only complaint is a muscle cramps. She does not take a statin, but does take a diuretic as part of her antihypertensive  regimen. Blood pressure control is excellent.  Past Medical History:  Diagnosis Date  . Aortic valve disease    severe AI  . Arthritis   . Fibromyalgia   . Heart murmur   . Systemic hypertension   . Tobacco abuse     Past Surgical History:  Procedure Laterality Date  . CARDIAC CATHETERIZATION  12/21/2001   moderately severe aortic regurg.,normal patent coronary arteries  . CHOLECYSTECTOMY    . NM MYOCAR PERF WALL MOTION  12/14/2001   anterior wall ischemia  . SHOULDER SURGERY    . US ECHOCARDIOGRAPHY  12/01/2011   mod.to severe AI,mild valvular aortic stenosis,LA mod. dilated,trace MR,mild to mod. TR,severe pulmonary hypertension,trace PI    Current Medications: Outpatient Medications Prior to Visit  Medication Sig Dispense Refill  . Calcium Carbonate-Vitamin D (CALCIUM + D PO) Take 1 tablet by mouth once a week.    Marland Kitchen EDARBYCLOR 40-12.5 MG TABS     . amLODipine (NORVASC) 2.5 MG tablet Take 1 tablet (2.5 mg total) by mouth daily. 180 tablet 3  . HYDROcodone-acetaminophen (NORCO) 10-325 MG per tablet Take by mouth as needed.     No facility-administered medications prior to visit.      Allergies:   Patient has no known allergies.   Social History   Social History  . Marital status: Married    Spouse name: N/A  . Number of children: N/A  . Years of education: N/A   Social History Main Topics  . Smoking status: Current Every Day Smoker    Packs/day: 0.25    Types: Cigarettes  . Smokeless tobacco: Current User  . Alcohol use 0.6 oz/week    1 Glasses of wine per week  Comment: occas.  . Drug use: No  . Sexual activity: Yes    Partners: Male    Birth control/ protection: None   Other Topics Concern  . None   Social History Narrative  . None     Family History:  The patient's family history includes Cancer in her sister; Heart attack in her father; Hypertension in her other; Stroke in her mother.   ROS:   Please see the history of present illness.    ROS  All other systems reviewed and are negative.   PHYSICAL EXAM:   VS:  BP (!) 122/54   Pulse 64   Ht  (1.702 m)   Wt 113 lb 12.8 oz (51.6 kg)   LMP  (LMP Unknown)   BMI 17.82 kg/m     General: Alert, oriented x3, no distress, Very lean. Head: no evidence of trauma, PERRL, EOMI, no exophtalmos or lid lag, no myxedema, no xanthelasma; normal ears, nose and oropharynx Neck: normal jugular venous pulsations and no hepatojugular reflux; brisk carotid pulses without delay and no carotid bruits Chest: clear to auscultation, no signs of consolidation by percussion or palpation, normal fremitus, symmetrical and full respiratory excursions Cardiovascular: normal position and quality of the apical impulse, regular rhythm, normal first and second heart sounds, no rubs or gallops, +ve Systolic click, 3/6 early peaking systolic ejection murmur and 3/6 decrescendo diastolic aortic murmur at the left lower sternal border and aortic focus,  Abdomen: no tenderness or distention, no masses by palpation, no abnormal pulsatility or arterial bruits, normal bowel sounds, no hepatosplenomegaly Extremities: no clubbing, cyanosis or edema; 2+ radial, ulnar and brachial pulses bilaterally; 2+ right femoral, posterior tibial and dorsalis pedis pulses; 2+ left femoral, posterior tibial and dorsalis pedis pulses; no subclavian or femoral bruits Neurological: grossly nonfocal Psych: Normal mood and affect    Wt Readings from Last 3 Encounters:  02/14/17 113 lb 12.8 oz (51.6 kg)  02/24/16 120 lb (54.4 kg)  01/20/16 121 lb 3.2 oz (55 kg)      Studies/Labs Reviewed:   EKG:  EKG is ordered today.  The ekg ordered today demonstrates Sinus rhythm with a mild first-degree AV block (PR 218 ms).  Labs requested from Dr. Tedra Senegal office.  ASSESSMENT:    1. Severe aortic insufficiency   2. Essential hypertension   3. Tobacco abuse      PLAN:  In order of problems listed above:  1. AI/bicuspid aortic valve:  She does not have any symptoms of aortic insufficiency or aortic stenosis, the left ventricle was not dilated, the aorta is normal in caliber. Will plan to check her echocardiogram in a couple of years, sooner if she develops symptoms of heart failure  2. HTN: Excellent blood pressure control 3. Smoking cessation was again reinforced as an important goal. It'll make her a better candidate for aortic valve replacement surgery.    Medication Adjustments/Labs and Tests Ordered: Current medicines are reviewed at length with the patient today.  Concerns regarding medicines are outlined above.  Medication changes, Labs and Tests ordered today are listed in the Patient Instructions below. Patient Instructions  Dr Royann Shivers recommends that you schedule a follow-up appointment in 12 months. You will receive a reminder letter in the mail two months in advance. If you don't receive a letter, please call our office to schedule the follow-up appointment.  If you need a refill on your cardiac medications before your next appointment, please call your pharmacy.    Signed, Rachelle Hora  Jemima Petko, MD  02/14/2017 12:34 PM    [facility] [address] [place], [place], [place]  [address] Phone: [phone]; Fax: [phone]

## 2017-02-14 NOTE — Patient Instructions (Signed)
Dr Croitoru recommends that you schedule a follow-up appointment in 12 months. You will receive a reminder letter in the mail two months in advance. If you don't receive a letter, please call our office to schedule the follow-up appointment.  If you need a refill on your cardiac medications before your next appointment, please call your pharmacy. 

## 2017-02-17 ENCOUNTER — Telehealth: Payer: Self-pay | Admitting: Cardiovascular Disease

## 2017-02-17 NOTE — Telephone Encounter (Signed)
Returned call to patient. Advised that generally cardiology will not Rx anything for leg cramps but she could contact PCP or try OTC magnesium or Co-Q10. Patient said OK and abruptly hung up the phone

## 2017-02-17 NOTE — Telephone Encounter (Signed)
Pt says she needs something called in for cramps in her legs asap please.The cramps are awful.

## 2017-02-27 ENCOUNTER — Other Ambulatory Visit (HOSPITAL_COMMUNITY)
Admission: RE | Admit: 2017-02-27 | Discharge: 2017-02-27 | Disposition: A | Discharge status: Home / Self Care | Payer: Medicare Other | Source: Ambulatory Visit | Attending: Certified Nurse Midwife | Admitting: Certified Nurse Midwife

## 2017-02-27 ENCOUNTER — Encounter: Payer: Self-pay | Admitting: Certified Nurse Midwife

## 2017-02-27 ENCOUNTER — Ambulatory Visit (INDEPENDENT_AMBULATORY_CARE_PROVIDER_SITE_OTHER): Payer: Medicare Other | Admitting: Certified Nurse Midwife

## 2017-02-27 VITALS — BP 169/65 | HR 69 | Ht 67.0 in | Wt 115.0 lb

## 2017-02-27 DIAGNOSIS — M797 Fibromyalgia: Secondary | ICD-10-CM | POA: Insufficient documentation

## 2017-02-27 DIAGNOSIS — Z01411 Encounter for gynecological examination (general) (routine) with abnormal findings: Secondary | ICD-10-CM | POA: Insufficient documentation

## 2017-02-27 DIAGNOSIS — Z01419 Encounter for gynecological examination (general) (routine) without abnormal findings: Secondary | ICD-10-CM

## 2017-02-27 DIAGNOSIS — R87810 Cervical high risk human papillomavirus (HPV) DNA test positive: Secondary | ICD-10-CM | POA: Diagnosis not present

## 2017-02-27 DIAGNOSIS — F17210 Nicotine dependence, cigarettes, uncomplicated: Secondary | ICD-10-CM | POA: Insufficient documentation

## 2017-02-27 NOTE — Progress Notes (Signed)
Subjective:        Valerie Murray is a 61 y.o. female here for a routine exam.   Greater than 7 years since last period.    Sister had endometrial and BCA, dx around 62 years of age and is living.  Smokes about 5 cigs/day.  Sexually active "in a blue moon".   Dental exam last exam unknown.  Has false teeth but cannot wear them because of bone deformities.  Declines STD blood testing.   Personal health questionnaire:  Is patient Ashkenazi Jewish, have a family history of breast and/or ovarian cancer: yes Is there a family history of uterine cancer diagnosed at age < 69, gastrointestinal cancer, urinary tract cancer, family member who is a Personnel officer syndrome-associated carrier: no Is the patient overweight and hypertensive, family history of diabetes, personal history of gestational diabetes, preeclampsia or PCOS: yes Is patient over 58, have PCOS,  family history of premature CHD under age 36, diabetes, smoke, have hypertension or peripheral artery disease:  yes At any time, has a partner hit, kicked or otherwise hurt or frightened you?: no Over the past 2 weeks, have you felt down, depressed or hopeless?: no Over the past 2 weeks, have you felt little interest or pleasure in doing things?:no   Gynecologic History No LMP recorded. Patient is postmenopausal. Contraception: post menopausal status Last Pap: 02/24/16. Results were: normal +HPV Last mammogram: unkown, need records from [place]. Results were: normal according to the patient.  Obstetric History OB History  Gravida Para Term Preterm AB Living  1 1 1     1   SAB TAB Ectopic Multiple Live Births          1    # Outcome Date GA Lbr Len/2nd Weight Sex Delivery Anes PTL Lv  1 Term 05/29/72    Rolm Baptise      Past Medical History:  Diagnosis Date  . Aortic valve disease    severe AI  . Arthritis   . Fibromyalgia   . Heart murmur   . Systemic hypertension   . Tobacco abuse     Past Surgical History:  Procedure  Laterality Date  . CARDIAC CATHETERIZATION  12/21/2001   moderately severe aortic regurg.,normal patent coronary arteries  . CHOLECYSTECTOMY    . NM MYOCAR PERF WALL MOTION  12/14/2001   anterior wall ischemia  . SHOULDER SURGERY    . US ECHOCARDIOGRAPHY  12/01/2011   mod.to severe AI,mild valvular aortic stenosis,LA mod. dilated,trace MR,mild to mod. TR,severe pulmonary hypertension,trace PI     Current Outpatient Prescriptions:  .  Calcium Carbonate-Vitamin D (CALCIUM + D PO), Take 1 tablet by mouth once a week., Disp: , Rfl:  .  EDARBYCLOR 40-12.5 MG TABS, , Disp: , Rfl:  .  amLODipine (NORVASC) 2.5 MG tablet, Take 1 tablet (2.5 mg total) by mouth daily., Disp: 180 tablet, Rfl: 3 No Known Allergies  Social History  Substance Use Topics  . Smoking status: Current Every Day Smoker    Packs/day: 0.25    Types: Cigarettes  . Smokeless tobacco: Current User  . Alcohol use 0.6 oz/week    1 Glasses of wine per week     Comment: occas.    Family History  Problem Relation Age of Onset  . Stroke Mother   . Heart attack Father   . Hypertension Other   . Cancer Sister        breast      Review of Systems  Constitutional:  negative for fatigue and weight loss Respiratory: negative for cough and wheezing Cardiovascular: negative for chest pain, fatigue and palpitations Gastrointestinal: negative for abdominal pain and change in bowel habits Musculoskeletal:negative for myalgias Neurological: negative for gait problems and tremors Behavioral/Psych: negative for abusive relationship, depression Endocrine: negative for temperature intolerance    Genitourinary:negative for abnormal menstrual periods, genital lesions, hot flashes, sexual problems and vaginal discharge Integument/breast: negative for breast lump, breast tenderness, nipple discharge and skin lesion(s)    Objective:       BP (!) 169/65   Pulse 69   Ht 5\' 7"  (1.702 m)   Wt 115 lb (52.2 kg)   LMP  (LMP Unknown)   BMI  18.01 kg/m  General:   alert  Skin:   no rash or abnormalities  Lungs:   clear to auscultation bilaterally  Heart:   regular rate and rhythm, S1, S2 normal, no murmur, click, rub or gallop  Breasts:   normal without suspicious masses, skin or nipple changes or axillary nodes  Abdomen:  normal findings: no organomegaly, soft, non-tender and no hernia  Pelvis:  External genitalia: normal general appearance Urinary system: urethral meatus normal and bladder without fullness, nontender Vaginal: normal without tenderness, induration or masses Cervix: normal appearance, no rugae Adnexa: normal bimanual exam Uterus: anteverted and non-tender, normal size   Lab Review Urine pregnancy test Labs reviewed yes Radiologic studies reviewed no  50% of 30 min visit spent on counseling and coordination of care.    Assessment & Plan    Healthy female exam.    1. Well woman exam     No STD screening - Cytology - PAP   Education reviewed: calcium supplements, depression evaluation, low fat, low cholesterol diet, safe sex/STD prevention, self breast exams, skin cancer screening, smoking cessation and weight bearing exercise. Contraception: abstinence and post menopausal status. Follow up in: 1 year.    [facility] Possible management options include: none Follow up as needed.

## 2017-02-27 NOTE — Progress Notes (Signed)
Patient is in the office for annual exam, pt states that she missed her BP med this morning.

## 2017-03-03 LAB — CYTOLOGY - PAP
HPV (WINDOPATH): DETECTED — AB
HPV 16/18/45 GENOTYPING: NEGATIVE

## 2017-03-06 ENCOUNTER — Other Ambulatory Visit: Payer: Self-pay | Admitting: Certified Nurse Midwife

## 2017-03-07 DIAGNOSIS — I10 Essential (primary) hypertension: Secondary | ICD-10-CM | POA: Diagnosis not present

## 2017-03-08 DIAGNOSIS — M810 Age-related osteoporosis without current pathological fracture: Secondary | ICD-10-CM | POA: Diagnosis not present

## 2017-03-08 DIAGNOSIS — Z1231 Encounter for screening mammogram for malignant neoplasm of breast: Secondary | ICD-10-CM | POA: Diagnosis not present

## 2017-03-08 DIAGNOSIS — Z803 Family history of malignant neoplasm of breast: Secondary | ICD-10-CM | POA: Diagnosis not present

## 2017-04-04 DIAGNOSIS — I10 Essential (primary) hypertension: Secondary | ICD-10-CM | POA: Diagnosis not present

## 2017-04-06 ENCOUNTER — Other Ambulatory Visit: Payer: Self-pay | Admitting: Family Medicine

## 2017-04-06 ENCOUNTER — Ambulatory Visit
Admission: RE | Admit: 2017-04-06 | Discharge: 2017-04-06 | Disposition: A | Discharge status: Home / Self Care | Payer: Medicare Other | Source: Ambulatory Visit | Attending: Family Medicine | Admitting: Family Medicine

## 2017-04-06 DIAGNOSIS — R05 Cough: Secondary | ICD-10-CM

## 2017-04-06 DIAGNOSIS — R059 Cough, unspecified: Secondary | ICD-10-CM

## 2017-04-06 DIAGNOSIS — R0789 Other chest pain: Secondary | ICD-10-CM | POA: Diagnosis not present

## 2017-04-12 ENCOUNTER — Telehealth: Payer: Self-pay | Admitting: Family Medicine

## 2017-04-12 NOTE — Telephone Encounter (Signed)
Not a patient at PCP--Law office was notified on 04/12/17

## 2017-04-12 NOTE — Telephone Encounter (Signed)
Copied from CRM [id number]. Topic: Medical Record Request - Attorney/Litigation >> Apr 12, 2017 10:56 AM Eston Mouldavis, Cheri B wrote: Patient Name/DOB/MRN #: Sarita HaverHarriet Kanner [birth year]  [id number] Requestor Name/Agency: [employer]  Call Back #:[phone]  ext [phone] Information Requested medical records from May 09 2002 - May 08 2006  Court needs by 12/14  Route to [facility]. For all other clinics, route to the clinic's PEC Pool.

## 2017-04-26 DIAGNOSIS — I10 Essential (primary) hypertension: Secondary | ICD-10-CM | POA: Diagnosis not present

## 2017-04-26 DIAGNOSIS — M810 Age-related osteoporosis without current pathological fracture: Secondary | ICD-10-CM | POA: Diagnosis not present

## 2017-04-26 DIAGNOSIS — M545 Low back pain: Secondary | ICD-10-CM | POA: Diagnosis not present

## 2017-04-28 DIAGNOSIS — M5416 Radiculopathy, lumbar region: Secondary | ICD-10-CM | POA: Diagnosis not present

## 2017-04-28 DIAGNOSIS — M9903 Segmental and somatic dysfunction of lumbar region: Secondary | ICD-10-CM | POA: Diagnosis not present

## 2017-04-28 DIAGNOSIS — M62830 Muscle spasm of back: Secondary | ICD-10-CM | POA: Diagnosis not present

## 2017-05-10 DIAGNOSIS — M62830 Muscle spasm of back: Secondary | ICD-10-CM | POA: Diagnosis not present

## 2017-05-10 DIAGNOSIS — M5416 Radiculopathy, lumbar region: Secondary | ICD-10-CM | POA: Diagnosis not present

## 2017-05-10 DIAGNOSIS — M9903 Segmental and somatic dysfunction of lumbar region: Secondary | ICD-10-CM | POA: Diagnosis not present

## 2017-05-15 DIAGNOSIS — M5416 Radiculopathy, lumbar region: Secondary | ICD-10-CM | POA: Diagnosis not present

## 2017-05-15 DIAGNOSIS — M9903 Segmental and somatic dysfunction of lumbar region: Secondary | ICD-10-CM | POA: Diagnosis not present

## 2017-05-15 DIAGNOSIS — M62830 Muscle spasm of back: Secondary | ICD-10-CM | POA: Diagnosis not present

## 2017-05-19 DIAGNOSIS — M545 Low back pain: Secondary | ICD-10-CM | POA: Diagnosis not present

## 2017-05-19 DIAGNOSIS — M810 Age-related osteoporosis without current pathological fracture: Secondary | ICD-10-CM | POA: Diagnosis not present

## 2017-05-19 DIAGNOSIS — Z0000 Encounter for general adult medical examination without abnormal findings: Secondary | ICD-10-CM | POA: Diagnosis not present

## 2017-05-22 DIAGNOSIS — M62830 Muscle spasm of back: Secondary | ICD-10-CM | POA: Diagnosis not present

## 2017-05-22 DIAGNOSIS — M9903 Segmental and somatic dysfunction of lumbar region: Secondary | ICD-10-CM | POA: Diagnosis not present

## 2017-05-22 DIAGNOSIS — M5416 Radiculopathy, lumbar region: Secondary | ICD-10-CM | POA: Diagnosis not present

## 2017-05-24 DIAGNOSIS — M9903 Segmental and somatic dysfunction of lumbar region: Secondary | ICD-10-CM | POA: Diagnosis not present

## 2017-05-24 DIAGNOSIS — M62830 Muscle spasm of back: Secondary | ICD-10-CM | POA: Diagnosis not present

## 2017-05-24 DIAGNOSIS — M5416 Radiculopathy, lumbar region: Secondary | ICD-10-CM | POA: Diagnosis not present

## 2017-05-29 DIAGNOSIS — M62830 Muscle spasm of back: Secondary | ICD-10-CM | POA: Diagnosis not present

## 2017-05-29 DIAGNOSIS — M5416 Radiculopathy, lumbar region: Secondary | ICD-10-CM | POA: Diagnosis not present

## 2017-05-29 DIAGNOSIS — M9903 Segmental and somatic dysfunction of lumbar region: Secondary | ICD-10-CM | POA: Diagnosis not present

## 2017-05-31 DIAGNOSIS — M5416 Radiculopathy, lumbar region: Secondary | ICD-10-CM | POA: Diagnosis not present

## 2017-05-31 DIAGNOSIS — M62830 Muscle spasm of back: Secondary | ICD-10-CM | POA: Diagnosis not present

## 2017-05-31 DIAGNOSIS — M9903 Segmental and somatic dysfunction of lumbar region: Secondary | ICD-10-CM | POA: Diagnosis not present

## 2017-06-02 DIAGNOSIS — M545 Low back pain: Secondary | ICD-10-CM | POA: Diagnosis not present

## 2017-06-02 DIAGNOSIS — M810 Age-related osteoporosis without current pathological fracture: Secondary | ICD-10-CM | POA: Diagnosis not present

## 2017-06-05 DIAGNOSIS — M62830 Muscle spasm of back: Secondary | ICD-10-CM | POA: Diagnosis not present

## 2017-06-05 DIAGNOSIS — M5416 Radiculopathy, lumbar region: Secondary | ICD-10-CM | POA: Diagnosis not present

## 2017-06-05 DIAGNOSIS — M9903 Segmental and somatic dysfunction of lumbar region: Secondary | ICD-10-CM | POA: Diagnosis not present

## 2017-06-23 DIAGNOSIS — J399 Disease of upper respiratory tract, unspecified: Secondary | ICD-10-CM | POA: Diagnosis not present

## 2017-06-23 DIAGNOSIS — I10 Essential (primary) hypertension: Secondary | ICD-10-CM | POA: Diagnosis not present

## 2017-07-25 DIAGNOSIS — I10 Essential (primary) hypertension: Secondary | ICD-10-CM | POA: Diagnosis not present

## 2017-08-28 DIAGNOSIS — M545 Low back pain: Secondary | ICD-10-CM | POA: Diagnosis not present

## 2017-08-28 DIAGNOSIS — M810 Age-related osteoporosis without current pathological fracture: Secondary | ICD-10-CM | POA: Diagnosis not present

## 2017-08-28 DIAGNOSIS — I10 Essential (primary) hypertension: Secondary | ICD-10-CM | POA: Diagnosis not present

## 2017-09-18 DIAGNOSIS — I10 Essential (primary) hypertension: Secondary | ICD-10-CM | POA: Diagnosis not present

## 2017-09-18 DIAGNOSIS — M545 Low back pain: Secondary | ICD-10-CM | POA: Diagnosis not present

## 2017-10-03 ENCOUNTER — Encounter: Payer: Self-pay | Admitting: Family Medicine

## 2017-10-16 DIAGNOSIS — M545 Low back pain: Secondary | ICD-10-CM | POA: Diagnosis not present

## 2017-10-16 DIAGNOSIS — I10 Essential (primary) hypertension: Secondary | ICD-10-CM | POA: Diagnosis not present

## 2017-11-27 DIAGNOSIS — M797 Fibromyalgia: Secondary | ICD-10-CM | POA: Diagnosis not present

## 2017-11-27 DIAGNOSIS — M545 Low back pain: Secondary | ICD-10-CM | POA: Diagnosis not present

## 2017-11-27 DIAGNOSIS — I10 Essential (primary) hypertension: Secondary | ICD-10-CM | POA: Diagnosis not present

## 2018-02-26 DIAGNOSIS — I10 Essential (primary) hypertension: Secondary | ICD-10-CM | POA: Diagnosis not present

## 2018-02-26 DIAGNOSIS — M797 Fibromyalgia: Secondary | ICD-10-CM | POA: Diagnosis not present

## 2018-02-26 DIAGNOSIS — E785 Hyperlipidemia, unspecified: Secondary | ICD-10-CM | POA: Diagnosis not present

## 2018-02-26 DIAGNOSIS — M810 Age-related osteoporosis without current pathological fracture: Secondary | ICD-10-CM | POA: Diagnosis not present

## 2018-03-02 ENCOUNTER — Ambulatory Visit: Payer: Medicare Other | Admitting: Family Medicine

## 2018-03-13 ENCOUNTER — Encounter: Payer: Self-pay | Admitting: Cardiovascular Disease

## 2018-03-13 ENCOUNTER — Ambulatory Visit (INDEPENDENT_AMBULATORY_CARE_PROVIDER_SITE_OTHER): Payer: Medicare Other | Admitting: Cardiovascular Disease

## 2018-03-13 VITALS — BP 162/54 | HR 68 | Ht 67.0 in | Wt 115.4 lb

## 2018-03-13 DIAGNOSIS — R636 Underweight: Secondary | ICD-10-CM | POA: Diagnosis not present

## 2018-03-13 DIAGNOSIS — F17200 Nicotine dependence, unspecified, uncomplicated: Secondary | ICD-10-CM

## 2018-03-13 DIAGNOSIS — I351 Nonrheumatic aortic (valve) insufficiency: Secondary | ICD-10-CM | POA: Diagnosis not present

## 2018-03-13 DIAGNOSIS — I10 Essential (primary) hypertension: Secondary | ICD-10-CM

## 2018-03-13 NOTE — Progress Notes (Signed)
Cardiology Office Note    Date:  03/18/2018   ID:  Tarea Skillman, DOB 06-19-1955, MRN [id number]  PCP:  System, Pcp Not In  Cardiologist:   Thurmon Fair, MD   Chief Complaint  Patient presents with  . Cardiac Valve Problem    Aortic insufficiency    History of Present Illness:  Valerie Murray is a 62 y.o. female with a bicuspid aortic valve and aortic insufficiency, but with preserved left ventricular size and systolic function and without significant dilation of the aortic root or known coarctation.   She usually has well-controlled blood pressure.  Just last week at her primary care doctor's office she reports that her systolic blood pressure was 129.  She thinks her blood pressure today is elevated since she is going to a double funeral and is emotional.  The patient specifically denies any chest pain at rest exertion, dyspnea at rest or with exertion, orthopnea, paroxysmal nocturnal dyspnea, syncope, palpitations, focal neurological deficits, intermittent claudication, lower extremity edema, unexplained weight gain, cough, hemoptysis or wheezing.  She remains very slender with a BMI of 18.  She blames her weight loss on Benicar which caused anorexia and diarrhea.  She is thinks she is doing much better on Micardis.  She continues to smoke, reportedly only a quarter of a pack a day.  She does not think the smoking has anything to do with her weight.  Her most recent echocardiogram performed on February 03, 2017 shows only moderate aortic regurgitation, mild aortic stenosis, with the leaflets being described a severely thickened and severely calcified. The aorta remains normal in caliber. The valve was described as being trileaflet on this most recent echo, but previous studies have described it as being bicuspid. The left ventricle was described as being dilated, but the end-diastolic diameter is only 47 mm and the end-systolic diameter is only 29 mm, which is  normal.    Past Medical History:  Diagnosis Date  . Aortic valve disease    severe AI  . Arthritis   . Fibromyalgia   . Heart murmur   . Systemic hypertension   . Tobacco abuse     Past Surgical History:  Procedure Laterality Date  . CARDIAC CATHETERIZATION  12/21/2001   moderately severe aortic regurg.,normal patent coronary arteries  . CHOLECYSTECTOMY    . NM MYOCAR PERF WALL MOTION  12/14/2001   anterior wall ischemia  . SHOULDER SURGERY    . US ECHOCARDIOGRAPHY  12/01/2011   mod.to severe AI,mild valvular aortic stenosis,LA mod. dilated,trace MR,mild to mod. TR,severe pulmonary hypertension,trace PI    Current Medications: Outpatient Medications Prior to Visit  Medication Sig Dispense Refill  . Calcium Carbonate-Vitamin D (CALCIUM + D PO) Take 1 tablet by mouth once a week.    . cyclobenzaprine (FLEXERIL) 10 MG tablet Take 10 mg by mouth as directed.    . telmisartan-hydrochlorothiazide (MICARDIS HCT) 40-12.5 MG tablet Take 1 tablet by mouth daily.    Marland Kitchen amLODipine (NORVASC) 2.5 MG tablet Take 1 tablet (2.5 mg total) by mouth daily. 180 tablet 3  . EDARBYCLOR 40-12.5 MG TABS      No facility-administered medications prior to visit.      Allergies:   Patient has no known allergies.   Social History   Socioeconomic History  . Marital status: Married    Spouse name: Not on file  . Number of children: Not on file  . Years of education: Not on file  . Highest education level: Not on  file  Occupational History  . Not on file  Social Needs  . Financial resource strain: Not on file  . Food insecurity:    Worry: Not on file    Inability: Not on file  . Transportation needs:    Medical: Not on file    Non-medical: Not on file  Tobacco Use  . Smoking status: Current Every Day Smoker    Packs/day: 0.25    Types: Cigarettes  . Smokeless tobacco: Current User  Substance and Sexual Activity  . Alcohol use: Yes    Alcohol/week: 1.0 standard drinks    Types: 1 Glasses  of wine per week    Comment: occas.  . Drug use: No  . Sexual activity: Not Currently    Partners: Male    Birth control/protection: None  Lifestyle  . Physical activity:    Days per week: Not on file    Minutes per session: Not on file  . Stress: Not on file  Relationships  . Social connections:    Talks on phone: Not on file    Gets together: Not on file    Attends religious service: Not on file    Active member of club or organization: Not on file    Attends meetings of clubs or organizations: Not on file    Relationship status: Not on file  Other Topics Concern  . Not on file  Social History Narrative  . Not on file     Family History:  The patient's family history includes Cancer in her sister; Heart attack in her father; Hypertension in her other; Stroke in her mother.   ROS:   Please see the history of present illness.     All other systems are reviewed and are negative  PHYSICAL EXAM:   VS:  BP (!) 162/54   Pulse 68   Ht 5\' 7"  (1.702 m)   Wt 115 lb 6.4 oz (52.3 kg)   LMP  (LMP Unknown)   BMI 18.07 kg/m      General: Alert, oriented x3, no distress, underweight Head: no evidence of trauma, PERRL, EOMI, no exophtalmos or lid lag, no myxedema, no xanthelasma; normal ears, nose and oropharynx Neck: normal jugular venous pulsations and no hepatojugular reflux; brisk carotid pulses without delay and no carotid bruits Chest: clear to auscultation, no signs of consolidation by percussion or palpation, normal fremitus, symmetrical and full respiratory excursions Cardiovascular: normal position and quality of the apical impulse, regular rhythm, normal first and second heart sounds, +ve Systolic click, 3/6 early peaking systolic ejection murmur and 3/6 decrescendo diastolic aortic murmur at the left lower sternal border and aortic focus Abdomen: no tenderness or distention, no masses by palpation, no abnormal pulsatility or arterial bruits, normal bowel sounds, no  hepatosplenomegaly Extremities: no clubbing, cyanosis or edema; 2+ radial, ulnar and brachial pulses bilaterally; 2+ right femoral, posterior tibial and dorsalis pedis pulses; 2+ left femoral, posterior tibial and dorsalis pedis pulses; no subclavian or femoral bruits Neurological: grossly nonfocal Psych: Normal mood and affect    Wt Readings from Last 3 Encounters:  03/13/18 115 lb 6.4 oz (52.3 kg)  02/27/17 115 lb (52.2 kg)  02/14/17 113 lb 12.8 oz (51.6 kg)      Studies/Labs Reviewed:   EKG:  EKG is ordered today.  The ekg ordered today is virtually identical to last year's tracing shows sinus rhythm with first-degree AV block (PR 216 ms), QTC 438 ms   ASSESSMENT:    1. Severe aortic  insufficiency   2. Essential hypertension   3. Smoking   4. Underweight      PLAN:  In order of problems listed above:  1. AI/bicuspid aortic valve: Remains asymptomatic.  Her murmur is very prominent.  Her pulse pressure is very broad, suggesting this is indeed severe AI despite the fact that her last echocardiogram is good as moderate.  We will repeat an echocardiogram before next year's appointment.  Asked her to contact us sooner if she develops symptoms of congestive heart failure.  2. HTN: She reports that her blood pressure was recently normal in Dr. Tedra Senegal office.  She has another medical exam planned on November 12.  She thinks her blood pressure today was elevated because of the funeral she is going to. 3. Smoking cessation was encouraged again.  We will increase the chance that she will be a good candidate for aortic valve surgery when the time comes. 4. Underweight: Her weight is unchanged over the last year, but 10-20 pounds less than she was 4-5 years ago.    Medication Adjustments/Labs and Tests Ordered: Current medicines are reviewed at length with the patient today.  Concerns regarding medicines are outlined above.  Medication changes, Labs and Tests ordered today are listed in  the Patient Instructions below. Patient Instructions  Medication Instructions:  Dr Royann Shivers recommends that you continue on your current medications as directed. Please refer to the Current Medication list given to you today.  If you need a refill on your cardiac medications before your next appointment, please call your pharmacy.   Testing/Procedures: Echocardiogram in 1 year - Your physician has requested that you have an echocardiogram. Echocardiography is a painless test that uses sound waves to create images of your heart. It provides your doctor with information about the size and shape of your heart and how well your heart's chambers and valves are working. This procedure takes approximately one hour. There are no restrictions for this procedure.  >> This will be performed at our [facility] location [address] [place], [address] [place] [address] [phone]  Follow-Up: At [facility], you and your health needs are our priority.  As part of our continuing mission to provide you with exceptional heart care, we have created designated Provider Care Teams.  These Care Teams include your primary Cardiologist (physician) and Advanced Practice Providers (APPs -  Physician Assistants and Nurse Practitioners) who all work together to provide you with the care you need, when you need it. You will need a follow up appointment in 12 months.  Please call our office 2 months in advance to schedule this appointment.  You may see Thurmon Fair, MD or one of the following Advanced Practice Providers on your designated Care Team: [place], [place] . Micah Flesher, PA-C    Signed, Thurmon Fair, MD  03/18/2018 10:18 AM    [facility] [address] [place], [place], [place]  [address] Phone: [phone]; Fax: [phone]

## 2018-03-13 NOTE — Patient Instructions (Signed)
Medication Instructions:  Dr Royann Shivers recommends that you continue on your current medications as directed. Please refer to the Current Medication list given to you today.  If you need a refill on your cardiac medications before your next appointment, please call your pharmacy.   Testing/Procedures: Echocardiogram in 1 year - Your physician has requested that you have an echocardiogram. Echocardiography is a painless test that uses sound waves to create images of your heart. It provides your doctor with information about the size and shape of your heart and how well your heart's chambers and valves are working. This procedure takes approximately one hour. There are no restrictions for this procedure.  >> This will be performed at our [facility] location [address] [place], [address] [place] [address] [phone]  Follow-Up: At [facility], you and your health needs are our priority.  As part of our continuing mission to provide you with exceptional heart care, we have created designated Provider Care Teams.  These Care Teams include your primary Cardiologist (physician) and Advanced Practice Providers (APPs -  Physician Assistants and Nurse Practitioners) who all work together to provide you with the care you need, when you need it. You will need a follow up appointment in 12 months.  Please call our office 2 months in advance to schedule this appointment.  You may see Thurmon Fair, MD or one of the following Advanced Practice Providers on your designated Care Team: [place], [place] . Micah Flesher, PA-C

## 2018-03-18 ENCOUNTER — Encounter: Payer: Self-pay | Admitting: Cardiovascular Disease

## 2018-03-18 DIAGNOSIS — R636 Underweight: Secondary | ICD-10-CM | POA: Insufficient documentation

## 2018-03-20 ENCOUNTER — Ambulatory Visit (INDEPENDENT_AMBULATORY_CARE_PROVIDER_SITE_OTHER): Payer: Medicare Other | Admitting: Obstetrics and Gynecology

## 2018-03-20 ENCOUNTER — Other Ambulatory Visit (HOSPITAL_COMMUNITY)
Admission: RE | Admit: 2018-03-20 | Discharge: 2018-03-20 | Disposition: A | Discharge status: Home / Self Care | Payer: Medicare Other | Source: Ambulatory Visit | Attending: Obstetrics and Gynecology | Admitting: Obstetrics and Gynecology

## 2018-03-20 ENCOUNTER — Encounter: Payer: Self-pay | Admitting: Obstetrics and Gynecology

## 2018-03-20 VITALS — BP 103/65 | HR 71 | Ht 67.0 in | Wt 118.2 lb

## 2018-03-20 DIAGNOSIS — Z01419 Encounter for gynecological examination (general) (routine) without abnormal findings: Secondary | ICD-10-CM | POA: Diagnosis not present

## 2018-03-20 NOTE — Progress Notes (Signed)
Subjective:     Valerie Murray is a 62 y.o. female P1 with BMI 18 postmenopausal for 8 years who is here for a comprehensive physical exam. The patient reports no problems. Patient denies any postmenopausal vaginal bleeding. She denies any pelvic pain or abnormal discharge. Patient is not sexually active. She denies any urinary incontinence with the exception of leaking with valsalva.  Past Medical History:  Diagnosis Date  . Aortic valve disease    severe AI  . Arthritis   . Fibromyalgia   . Heart murmur   . Systemic hypertension   . Tobacco abuse    Past Surgical History:  Procedure Laterality Date  . CARDIAC CATHETERIZATION  12/21/2001   moderately severe aortic regurg.,normal patent coronary arteries  . CHOLECYSTECTOMY    . NM MYOCAR PERF WALL MOTION  12/14/2001   anterior wall ischemia  . SHOULDER SURGERY    . US ECHOCARDIOGRAPHY  12/01/2011   mod.to severe AI,mild valvular aortic stenosis,LA mod. dilated,trace MR,mild to mod. TR,severe pulmonary hypertension,trace PI   Family History  Problem Relation Age of Onset  . Stroke Mother   . Heart attack Father   . Hypertension Other   . Cancer Sister        breast   Social History   Socioeconomic History  . Marital status: Single    Spouse name: Not on file  . Number of children: Not on file  . Years of education: Not on file  . Highest education level: Not on file  Occupational History  . Not on file  Social Needs  . Financial resource strain: Not on file  . Food insecurity:    Worry: Not on file    Inability: Not on file  . Transportation needs:    Medical: Not on file    Non-medical: Not on file  Tobacco Use  . Smoking status: Current Every Day Smoker    Packs/day: 0.25    Types: Cigarettes  . Smokeless tobacco: Current User  Substance and Sexual Activity  . Alcohol use: Yes    Alcohol/week: 1.0 standard drinks    Types: 1 Glasses of wine per week    Comment: occas.  . Drug use: No  . Sexual activity:  Not Currently    Partners: Male    Birth control/protection: None  Lifestyle  . Physical activity:    Days per week: Not on file    Minutes per session: Not on file  . Stress: Not on file  Relationships  . Social connections:    Talks on phone: Not on file    Gets together: Not on file    Attends religious service: Not on file    Active member of club or organization: Not on file    Attends meetings of clubs or organizations: Not on file    Relationship status: Not on file  . Intimate partner violence:    Fear of current or ex partner: Not on file    Emotionally abused: Not on file    Physically abused: Not on file    Forced sexual activity: Not on file  Other Topics Concern  . Not on file  Social History Narrative  . Not on file   Health Maintenance  Topic Date Due  . Hepatitis C Screening  1956/01/30  . HIV Screening  12/05/1969  . TETANUS/TDAP  12/05/1973  . MAMMOGRAM  12/05/2004  . COLONOSCOPY  12/05/2004  . INFLUENZA VACCINE  12/07/2017  . PAP SMEAR  02/28/2020  Review of Systems Pertinent items are noted in HPI.   Objective:  Blood pressure (!) 166/75, repeat BP 103/65  pulse 87, height 5\' 7"  (1.702 m), weight 118 lb 3.2 oz (53.6 kg).     GENERAL: Well-developed, well-nourished female in no acute distress.  HEENT: Normocephalic, atraumatic. Sclerae anicteric.  NECK: Supple. Normal thyroid.  LUNGS: Clear to auscultation bilaterally.  HEART: Regular rate and rhythm. BREASTS: Symmetric in size. No palpable masses or lymphadenopathy, skin changes, or nipple drainage. ABDOMEN: Soft, nontender, nondistended. No organomegaly. PELVIC: Normal external female genitalia. Vagina is pink and rugated.  Normal discharge. Normal appearing cervix. Uterus is normal in size. No adnexal mass or tenderness. EXTREMITIES: No cyanosis, clubbing, or edema, 2+ distal pulses.    Assessment:    Healthy female exam.      Plan:    pap smear collected Screening mammogram  scheduled for later this year. Will obtain records from 2018 Patient reports a normal colonoscopy 2 years ago Patient will be contacted with abnormal results See After Visit Summary for Counseling Recommendations

## 2018-03-20 NOTE — Progress Notes (Signed)
Pt is here for annual exam. Last pap 02/27/17 and was unsatisfactory with positive HPV. G1P1, vaginal delivery. Last MMG: last year, normal per patient. Last colonoscopy: 2 years ago, normal per patient (polyp removal).

## 2018-03-23 LAB — CYTOLOGY - PAP
Adequacy: ABSENT
Diagnosis: NEGATIVE
HPV 16/18/45 GENOTYPING: NEGATIVE
HPV: DETECTED — AB

## 2018-07-02 DIAGNOSIS — E785 Hyperlipidemia, unspecified: Secondary | ICD-10-CM | POA: Diagnosis not present

## 2018-07-02 DIAGNOSIS — R05 Cough: Secondary | ICD-10-CM | POA: Diagnosis not present

## 2018-07-02 DIAGNOSIS — I10 Essential (primary) hypertension: Secondary | ICD-10-CM | POA: Diagnosis not present

## 2018-07-02 DIAGNOSIS — J399 Disease of upper respiratory tract, unspecified: Secondary | ICD-10-CM | POA: Diagnosis not present

## 2018-07-02 DIAGNOSIS — M797 Fibromyalgia: Secondary | ICD-10-CM | POA: Diagnosis not present

## 2018-07-02 DIAGNOSIS — J349 Unspecified disorder of nose and nasal sinuses: Secondary | ICD-10-CM | POA: Diagnosis not present

## 2018-07-02 DIAGNOSIS — Z0000 Encounter for general adult medical examination without abnormal findings: Secondary | ICD-10-CM | POA: Diagnosis not present

## 2018-07-13 DIAGNOSIS — I10 Essential (primary) hypertension: Secondary | ICD-10-CM | POA: Diagnosis not present

## 2018-07-13 DIAGNOSIS — H60509 Unspecified acute noninfective otitis externa, unspecified ear: Secondary | ICD-10-CM | POA: Diagnosis not present

## 2018-07-13 DIAGNOSIS — E785 Hyperlipidemia, unspecified: Secondary | ICD-10-CM | POA: Diagnosis not present

## 2018-09-14 DIAGNOSIS — I10 Essential (primary) hypertension: Secondary | ICD-10-CM | POA: Diagnosis not present

## 2018-09-14 DIAGNOSIS — M5432 Sciatica, left side: Secondary | ICD-10-CM | POA: Diagnosis not present

## 2018-09-14 DIAGNOSIS — M797 Fibromyalgia: Secondary | ICD-10-CM | POA: Diagnosis not present

## 2018-10-11 ENCOUNTER — Emergency Department (HOSPITAL_BASED_OUTPATIENT_CLINIC_OR_DEPARTMENT_OTHER)
Admission: EM | Admit: 2018-10-11 | Discharge: 2018-10-11 | Disposition: A | Discharge status: Home / Self Care | Payer: Medicare Other | Attending: Emergency Medicine | Admitting: Emergency Medicine

## 2018-10-11 ENCOUNTER — Other Ambulatory Visit: Payer: Self-pay

## 2018-10-11 ENCOUNTER — Encounter (HOSPITAL_BASED_OUTPATIENT_CLINIC_OR_DEPARTMENT_OTHER): Payer: Self-pay

## 2018-10-11 DIAGNOSIS — Y92512 Supermarket, store or market as the place of occurrence of the external cause: Secondary | ICD-10-CM | POA: Diagnosis not present

## 2018-10-11 DIAGNOSIS — F17210 Nicotine dependence, cigarettes, uncomplicated: Secondary | ICD-10-CM | POA: Insufficient documentation

## 2018-10-11 DIAGNOSIS — S7012XA Contusion of left thigh, initial encounter: Secondary | ICD-10-CM | POA: Diagnosis not present

## 2018-10-11 DIAGNOSIS — I10 Essential (primary) hypertension: Secondary | ICD-10-CM | POA: Insufficient documentation

## 2018-10-11 DIAGNOSIS — J449 Chronic obstructive pulmonary disease, unspecified: Secondary | ICD-10-CM | POA: Diagnosis not present

## 2018-10-11 DIAGNOSIS — Y9389 Activity, other specified: Secondary | ICD-10-CM | POA: Insufficient documentation

## 2018-10-11 DIAGNOSIS — Z79899 Other long term (current) drug therapy: Secondary | ICD-10-CM | POA: Diagnosis not present

## 2018-10-11 DIAGNOSIS — Y999 Unspecified external cause status: Secondary | ICD-10-CM | POA: Insufficient documentation

## 2018-10-11 DIAGNOSIS — S79922A Unspecified injury of left thigh, initial encounter: Secondary | ICD-10-CM | POA: Diagnosis present

## 2018-10-11 MED ORDER — TRAMADOL HCL 50 MG PO TABS: 50.0000 mg | ORAL_TABLET | Freq: Four times a day (QID) | ORAL | 0 refills | Status: DC | PRN

## 2018-10-11 NOTE — Discharge Instructions (Signed)
Ice for 20 minutes every 2 hours while awake for the next 2 days.  Tramadol as prescribed as needed for pain.  Follow-up with your primary doctor if symptoms or not improving in the next week.
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
## 2019-03-11 ENCOUNTER — Encounter (INDEPENDENT_AMBULATORY_CARE_PROVIDER_SITE_OTHER): Payer: Self-pay

## 2019-03-11 ENCOUNTER — Ambulatory Visit (HOSPITAL_COMMUNITY): Payer: Medicare Other | Attending: Internal Medicine

## 2019-03-11 ENCOUNTER — Other Ambulatory Visit: Payer: Self-pay

## 2019-03-11 DIAGNOSIS — I351 Nonrheumatic aortic (valve) insufficiency: Secondary | ICD-10-CM | POA: Diagnosis not present

## 2019-03-14 DIAGNOSIS — Z1231 Encounter for screening mammogram for malignant neoplasm of breast: Secondary | ICD-10-CM | POA: Diagnosis not present

## 2019-03-14 DIAGNOSIS — Z803 Family history of malignant neoplasm of breast: Secondary | ICD-10-CM | POA: Diagnosis not present

## 2019-03-29 ENCOUNTER — Ambulatory Visit (INDEPENDENT_AMBULATORY_CARE_PROVIDER_SITE_OTHER): Payer: Medicare Other | Admitting: Cardiovascular Disease

## 2019-03-29 ENCOUNTER — Encounter: Payer: Self-pay | Admitting: Cardiovascular Disease

## 2019-03-29 ENCOUNTER — Other Ambulatory Visit: Payer: Self-pay

## 2019-03-29 VITALS — BP 141/52 | HR 75 | Temp 97.5°F | Ht 67.0 in | Wt 129.6 lb

## 2019-03-29 DIAGNOSIS — I351 Nonrheumatic aortic (valve) insufficiency: Secondary | ICD-10-CM

## 2019-03-29 DIAGNOSIS — F17200 Nicotine dependence, unspecified, uncomplicated: Secondary | ICD-10-CM | POA: Diagnosis not present

## 2019-03-29 DIAGNOSIS — I10 Essential (primary) hypertension: Secondary | ICD-10-CM

## 2019-03-29 DIAGNOSIS — R636 Underweight: Secondary | ICD-10-CM

## 2019-03-29 MED ORDER — TELMISARTAN 40 MG PO TABS: 40.0000 mg | ORAL_TABLET | Freq: Every day | ORAL | 3 refills | Status: DC

## 2019-03-29 MED ORDER — AMLODIPINE BESYLATE 5 MG PO TABS: 5.0000 mg | ORAL_TABLET | Freq: Every day | ORAL | 3 refills | Status: DC

## 2019-03-29 NOTE — Progress Notes (Signed)
Cardiology Office Note    Date:  03/30/2019   ID:  Valerie Murray, DOB October 31, 1955, MRN [id number]  PCP:  Lucianne Lei, MD  Cardiologist:   Sanda Klein, MD   Chief Complaint  Patient presents with  . Cardiac Valve Problem    Aortic insufficiency    History of Present Illness:  Valerie Murray is a 63 y.o. female with isolated moderate aortic insufficiency.  She was exposed to coronavirus via her niece and tested positive, but never had symptoms.  She has completed her period of isolation.  She is still smoking.  She continues to remain asymptomatic from a cardiac point of view.  On her last echocardiogram we got a much better look at her aortic valve, which clearly has 3 cusps.  She does have moderate to severe aortic insufficiency and there is no significant dilation of the aortic root.  The patient specifically denies any chest pain at rest exertion, dyspnea at rest or with exertion, orthopnea, paroxysmal nocturnal dyspnea, syncope, palpitations, focal neurological deficits, intermittent claudication, lower extremity edema, unexplained weight gain, cough, hemoptysis or wheezing.  She has very frequent muscle cramps.  When she inadvertently skipped her antihypertensive medication the cramps resolved and then returned when she started her medicines back.  Her most recent echocardiogram performed on 03/11/2019.  Clearly there are 3 aortic cusps, which appear thickened around the rim, suggestive of possible rheumatic valve disease.  There is no accompanying mitral valve disease.  She has very mild aortic stenosis, but the gradients are exaggerated due to the increased stroke-volume.  Left ventricular size, systolic and diastolic function remain completely normal.  The aorta is not dilated and there is no coarctation seen.    Past Medical History:  Diagnosis Date  . Aortic valve disease    severe AI  . Arthritis   . Fibromyalgia   . Heart murmur   . Systemic hypertension    . Tobacco abuse     Past Surgical History:  Procedure Laterality Date  . CARDIAC CATHETERIZATION  12/21/2001   moderately severe aortic regurg.,normal patent coronary arteries  . CHOLECYSTECTOMY    . NM MYOCAR PERF WALL MOTION  12/14/2001   anterior wall ischemia  . SHOULDER SURGERY    . US ECHOCARDIOGRAPHY  12/01/2011   mod.to severe AI,mild valvular aortic stenosis,LA mod. dilated,trace MR,mild to mod. TR,severe pulmonary hypertension,trace PI    Current Medications: Outpatient Medications Prior to Visit  Medication Sig Dispense Refill  . Calcium Carbonate-Vitamin D (CALCIUM + D PO) Take 1 tablet by mouth once a week.    . cyclobenzaprine (FLEXERIL) 10 MG tablet Take 10 mg by mouth as directed.    . traMADol (ULTRAM) 50 MG tablet Take 1 tablet (50 mg total) by mouth every 6 (six) hours as needed. 12 tablet 0  . telmisartan-hydrochlorothiazide (MICARDIS HCT) 40-12.5 MG tablet Take 1 tablet by mouth daily.     No facility-administered medications prior to visit.      Allergies:   Patient has no known allergies.   Social History   Socioeconomic History  . Marital status: Single    Spouse name: Not on file  . Number of children: Not on file  . Years of education: Not on file  . Highest education level: Not on file  Occupational History  . Not on file  Social Needs  . Financial resource strain: Not on file  . Food insecurity    Worry: Not on file    Inability: Not on file  .  Transportation needs    Medical: Not on file    Non-medical: Not on file  Tobacco Use  . Smoking status: Current Every Day Smoker    Packs/day: 0.25    Types: Cigarettes  . Smokeless tobacco: Current User  Substance and Sexual Activity  . Alcohol use: Yes    Alcohol/week: 1.0 standard drinks    Types: 1 Glasses of wine per week    Comment: occ  . Drug use: No  . Sexual activity: Not on file  Lifestyle  . Physical activity    Days per week: Not on file    Minutes per session: Not on file  .  Stress: Not on file  Relationships  . Social Musicianconnections    Talks on phone: Not on file    Gets together: Not on file    Attends religious service: Not on file    Active member of club or organization: Not on file    Attends meetings of clubs or organizations: Not on file    Relationship status: Not on file  Other Topics Concern  . Not on file  Social History Narrative  . Not on file     Family History:  The patient's family history includes Cancer in her sister; Heart attack in her father; Hypertension in an other family member; Stroke in her mother.   ROS:   Please see the history of present illness.    All other systems are reviewed and are negative.   PHYSICAL EXAM:   VS:  BP (!) 141/52   Pulse 75   Temp (!) 97.5 F (36.4 C)   Ht 5\' 7"  (1.702 m)   Wt 129 lb 9.6 oz (58.8 kg)   LMP  (LMP Unknown)   SpO2 98%   BMI 20.30 kg/m       General: Alert, oriented x3, no distress, very slender Head: no evidence of trauma, PERRL, EOMI, no exophtalmos or lid lag, no myxedema, no xanthelasma; normal ears, nose and oropharynx Neck: normal jugular venous pulsations and no hepatojugular reflux; brisk carotid pulses without delay and no carotid bruits Chest: clear to auscultation, no signs of consolidation by percussion or palpation, normal fremitus, symmetrical and full respiratory excursions Cardiovascular: normal position and quality of the apical impulse, regular rhythm, normal first and second heart sounds, no rubs or gallops.  Early peaking 3/6 aortic ejection murmur, decrescendo 3/6 aortic insufficiency murmur heard best at the right lower sternal border Abdomen: no tenderness or distention, no masses by palpation, no abnormal pulsatility or arterial bruits, normal bowel sounds, no hepatosplenomegaly Extremities: no clubbing, cyanosis or edema; 2+ radial, ulnar and brachial pulses bilaterally; 2+ right femoral, posterior tibial and dorsalis pedis pulses; 2+ left femoral, posterior  tibial and dorsalis pedis pulses; no subclavian or femoral bruits Neurological: grossly nonfocal Psych: Normal mood and affect   Wt Readings from Last 3 Encounters:  03/29/19 129 lb 9.6 oz (58.8 kg)  10/11/18 125 lb (56.7 kg)  03/20/18 118 lb 3.2 oz (53.6 kg)    Studies/Labs Reviewed:   EKG:  EKG is ordered today.  It shows normal sinus rhythm is a normal tracing with borderline prolonged PR interval.  ECHO 03/11/2019  1. Left ventricular ejection fraction, by visual estimation, is 60 to 65%. The left ventricle has normal function. There is mildly increased left ventricular hypertrophy.  2. Global right ventricle has normal systolic function.The right ventricular size is normal. No increase in right ventricular wall thickness.  3. Left atrial size was  normal.  4. Right atrial size was normal.  5. The mitral valve is abnormal. Trace mitral valve regurgitation.  6. The tricuspid valve is grossly normal. Tricuspid valve regurgitation is trivial.  7. Aortic valve area, by VTI measures 1.70 cm.  8. Aortic valve mean gradient measures 19.0 mmHg.  9. Aortic valve peak gradient measures 42.0 mmHg. 10. Aortic valve regurgitation is moderate to severe. 11. The aortic valve is bicuspid. Aortic valve regurgitation is moderate to severe. Mild aortic valve stenosis. 12. The pulmonic valve was grossly normal. Pulmonic valve regurgitation is not visualized. 13. The inferior vena cava is normal in size with greater than 50% respiratory variability, suggesting right atrial pressure of 3 mmHg.   ASSESSMENT:    1. Severe aortic insufficiency   2. Essential hypertension   3. Smoking   4. Underweight      PLAN:  In order of problems listed above:  1. Moderate to severe aortic insufficiency: She has no signs or symptoms of congestive heart failure and the ventricle is not excessively dilated or depressed.  She does have a very broad pulse pressure.   2. HTN: She complains of cramps from her  blood pressure medication, which resolved when she skipped it.  This is probably due to the hydrochlorothiazide.  We will stop this and replace it with amlodipine. 3. Smoking cessation continues to smoke 1/4 to 1/2 pack of cigarettes a day and does not think her nerves were allow her to quit.  I pointed out that eventually she probably will need aortic valve surgery and it would be important to go into that procedure with healthy lungs. 4. Underweight: She has gained back some weight.  She is trying to eat a nutritious diet.  She remains borderline underweight.    Medication Adjustments/Labs and Tests Ordered: Current medicines are reviewed at length with the patient today.  Concerns regarding medicines are outlined above.  Medication changes, Labs and Tests ordered today are listed in the Patient Instructions below. Patient Instructions  Medication Instructions:   Your physician has recommended you make the following change in your medication:   1) Stop Telmisartan-HCTZ 2) Start Telmisartan , 1 tablet by mouth once a day and Amlodipine , 1 tablet by mouth once a day.   *If you need a refill on your cardiac medications before your next appointment, please call your pharmacy*  Lab Work:  None ordered today  Testing/Procedures:  Your physician has requested that you have an echocardiogram in about 1 year, we will call to get this scheduled about 1 week before your yearly follow up with Dr. Royann Shivers. Echocardiography is a painless test that uses sound waves to create images of your heart. It provides your doctor with information about the size and shape of your heart and how well your heart's chambers and valves are working. This procedure takes approximately one hour. There are no restrictions for this procedure.   Follow-Up: At [facility], you and your health needs are our priority.  As part of our continuing mission to provide you with exceptional heart care, we have created  designated Provider Care Teams.  These Care Teams include your primary Cardiologist (physician) and Advanced Practice Providers (APPs -  Physician Assistants and Nurse Practitioners) who all work together to provide you with the care you need, when you need it.  Your next appointment:   12 months  The format for your next appointment:   In Person  Provider:   You may see Sidrah Harden,  MD or one of the following Advanced Practice Providers on your designated Care Team:    Azalee Course, PA-C  Micah Flesher, [place] or   Judy Pimple, [place]   Other Instructions    Smoking Tobacco Information, Adult Smoking tobacco can be harmful to your health. Tobacco contains a poisonous (toxic), colorless chemical called nicotine. Nicotine is addictive. It changes the brain and can make it hard to stop smoking. Tobacco also has other toxic chemicals that can hurt your body and raise your risk of many cancers. How can smoking tobacco affect me? Smoking tobacco puts you at risk for:  Cancer. Smoking is most commonly associated with lung cancer, but can also lead to cancer in other parts of the body.  Chronic obstructive pulmonary disease (COPD). This is a long-term lung condition that makes it hard to breathe. It also gets worse over time.  High blood pressure (hypertension), heart disease, stroke, or heart attack.  Lung infections, such as pneumonia.  Cataracts. This is when the lenses in the eyes become clouded.  Digestive problems. This may include peptic ulcers, heartburn, and gastroesophageal reflux disease (GERD).  Oral health problems, such as gum disease and tooth loss.  Loss of taste and smell. Smoking can affect your appearance by causing:  Wrinkles.  Yellow or stained teeth, fingers, and fingernails. Smoking tobacco can also affect your social life, because:  It may be challenging to find places to smoke when away from home. Many workplaces, [employer], hotels, and public places  are tobacco-free.  Smoking is expensive. This is due to the cost of tobacco and the long-term costs of treating health problems from smoking.  Secondhand smoke may affect those around you. Secondhand smoke can cause lung cancer, breathing problems, and heart disease. Children of smokers have a higher risk for: ? Sudden infant death syndrome (SIDS). ? Ear infections. ? Lung infections. If you currently smoke tobacco, quitting now can help you:  Lead a longer and healthier life.  Look, smell, breathe, and feel better over time.  Save money.  Protect others from the harms of secondhand smoke. What actions can I take to prevent health problems? Quit smoking   Do not start smoking. Quit if you already do.  Make a plan to quit smoking and commit to it. Look for programs to help you and ask your health care provider for recommendations and ideas.  Set a date and write down all the reasons you want to quit.  Let your friends and family know you are quitting so they can help and support you. Consider finding friends who also want to quit. It can be easier to quit with someone else, so that you can support each other.  Talk with your health care provider about using nicotine replacement medicines to help you quit, such as gum, lozenges, patches, sprays, or pills.  Do not replace cigarette smoking with electronic cigarettes, which are commonly called e-cigarettes. The safety of e-cigarettes is not known, and some may contain harmful chemicals.  If you try to quit but return to smoking, stay positive. It is common to slip up when you first quit, so take it one day at a time.  Be prepared for cravings. When you feel the urge to smoke, chew gum or suck on hard candy. Lifestyle  Stay busy and take care of your body.  Drink enough fluid to keep your urine pale yellow.  Get plenty of exercise and eat a healthy diet. This can help prevent weight gain after  quitting.  Monitor your eating  habits. Quitting smoking can cause you to have a larger appetite than when you smoke.  Find ways to relax. Go out with friends or family to a movie or a restaurant where people do not smoke.  Ask your health care provider about having regular tests (screenings) to check for cancer. This may include blood tests, imaging tests, and other tests.  Find ways to manage your stress, such as meditation, yoga, or exercise. Where to find support To get support to quit smoking, consider:  Asking your health care provider for more information and resources.  Taking classes to learn more about quitting smoking.  Looking for local organizations that offer resources about quitting smoking.  Joining a support group for people who want to quit smoking in your local community.  Calling the smokefree.gov counselor helpline: 1-800-Quit-Now 530-311-3637) Where to find more information You may find more information about quitting smoking from:  HelpGuide.org: www.helpguide.org  BankRights.uy: smokefree.gov  American Lung Association: www.lung.org Contact a health care provider if you:  Have problems breathing.  Notice that your lips, nose, or fingers turn blue.  Have chest pain.  Are coughing up blood.  Feel faint or you pass out.  Have other health changes that cause you to worry. Summary  Smoking tobacco can negatively affect your health, the health of those around you, your finances, and your social life.  Do not start smoking. Quit if you already do. If you need help quitting, ask your health care provider.  Think about joining a support group for people who want to quit smoking in your local community. There are many effective programs that will help you to quit this behavior. This information is not intended to replace advice given to you by your health care provider. Make sure you discuss any questions you have with your health care provider. Document Released: 05/10/2016 Document  Revised: 06/14/2017 Document Reviewed: 05/10/2016 Elsevier Patient Education  2020 [employer].     Signed, Thurmon Fair, MD  03/30/2019 3:16 PM    [facility] [address] [place], [place], [place]  [address] Phone: [phone]; Fax: [phone]

## 2019-03-29 NOTE — Patient Instructions (Addendum)
Medication Instructions:   Your physician has recommended you make the following change in your medication:   1) Stop Telmisartan-HCTZ 2) Start Telmisartan 40MG , 1 tablet by mouth once a day and Amlodipine 5MG , 1 tablet by mouth once a day.   *If you need a refill on your cardiac medications before your next appointment, please call your pharmacy*  Lab Work:  None ordered today  Testing/Procedures:  Your physician has requested that you have an echocardiogram in about 1 year, we will call to get this scheduled about 1 week before your yearly follow up with Dr. Sallyanne Kuster. Echocardiography is a painless test that uses sound waves to create images of your heart. It provides your doctor with information about the size and shape of your heart and how well your heart's chambers and valves are working. This procedure takes approximately one hour. There are no restrictions for this procedure.   Follow-Up: At [facility], you and your health needs are our priority.  As part of our continuing mission to provide you with exceptional heart care, we have created designated Provider Care Teams.  These Care Teams include your primary Cardiologist (physician) and Advanced Practice Providers (APPs -  Physician Assistants and Nurse Practitioners) who all work together to provide you with the care you need, when you need it.  Your next appointment:   12 months  The format for your next appointment:   In Person  Provider:   You may see Sanda Klein, MD or one of the following Advanced Practice Providers on your designated Care Team:    Almyra Deforest, PA-C  Fabian Sharp, [place] or   Roby Lofts, [place]   Other Instructions    Smoking Tobacco Information, Adult Smoking tobacco can be harmful to your health. Tobacco contains a poisonous (toxic), colorless chemical called nicotine. Nicotine is addictive. It changes the brain and can make it hard to stop smoking. Tobacco also has other toxic chemicals  that can hurt your body and raise your risk of many cancers. How can smoking tobacco affect me? Smoking tobacco puts you at risk for:  Cancer. Smoking is most commonly associated with lung cancer, but can also lead to cancer in other parts of the body.  Chronic obstructive pulmonary disease (COPD). This is a long-term lung condition that makes it hard to breathe. It also gets worse over time.  High blood pressure (hypertension), heart disease, stroke, or heart attack.  Lung infections, such as pneumonia.  Cataracts. This is when the lenses in the eyes become clouded.  Digestive problems. This may include peptic ulcers, heartburn, and gastroesophageal reflux disease (GERD).  Oral health problems, such as gum disease and tooth loss.  Loss of taste and smell. Smoking can affect your appearance by causing:  Wrinkles.  Yellow or stained teeth, fingers, and fingernails. Smoking tobacco can also affect your social life, because:  It may be challenging to find places to smoke when away from home. Many workplaces, [employer], hotels, and public places are tobacco-free.  Smoking is expensive. This is due to the cost of tobacco and the long-term costs of treating health problems from smoking.  Secondhand smoke may affect those around you. Secondhand smoke can cause lung cancer, breathing problems, and heart disease. Children of smokers have a higher risk for: ? Sudden infant death syndrome (SIDS). ? Ear infections. ? Lung infections. If you currently smoke tobacco, quitting now can help you:  Lead a longer and healthier life.  Look, smell, breathe, and feel better over  time.  Save money.  Protect others from the harms of secondhand smoke. What actions can I take to prevent health problems? Quit smoking   Do not start smoking. Quit if you already do.  Make a plan to quit smoking and commit to it. Look for programs to help you and ask your health care provider for recommendations  and ideas.  Set a date and write down all the reasons you want to quit.  Let your friends and family know you are quitting so they can help and support you. Consider finding friends who also want to quit. It can be easier to quit with someone else, so that you can support each other.  Talk with your health care provider about using nicotine replacement medicines to help you quit, such as gum, lozenges, patches, sprays, or pills.  Do not replace cigarette smoking with electronic cigarettes, which are commonly called e-cigarettes. The safety of e-cigarettes is not known, and some may contain harmful chemicals.  If you try to quit but return to smoking, stay positive. It is common to slip up when you first quit, so take it one day at a time.  Be prepared for cravings. When you feel the urge to smoke, chew gum or suck on hard candy. Lifestyle  Stay busy and take care of your body.  Drink enough fluid to keep your urine pale yellow.  Get plenty of exercise and eat a healthy diet. This can help prevent weight gain after quitting.  Monitor your eating habits. Quitting smoking can cause you to have a larger appetite than when you smoke.  Find ways to relax. Go out with friends or family to a movie or a restaurant where people do not smoke.  Ask your health care provider about having regular tests (screenings) to check for cancer. This may include blood tests, imaging tests, and other tests.  Find ways to manage your stress, such as meditation, yoga, or exercise. Where to find support To get support to quit smoking, consider:  Asking your health care provider for more information and resources.  Taking classes to learn more about quitting smoking.  Looking for local organizations that offer resources about quitting smoking.  Joining a support group for people who want to quit smoking in your local community.  Calling the smokefree.gov counselor helpline: 1-800-Quit-Now (201) 529-7162)  Where to find more information You may find more information about quitting smoking from:  HelpGuide.org: www.helpguide.org  BankRights.uy: smokefree.gov  American Lung Association: www.lung.org Contact a health care provider if you:  Have problems breathing.  Notice that your lips, nose, or fingers turn blue.  Have chest pain.  Are coughing up blood.  Feel faint or you pass out.  Have other health changes that cause you to worry. Summary  Smoking tobacco can negatively affect your health, the health of those around you, your finances, and your social life.  Do not start smoking. Quit if you already do. If you need help quitting, ask your health care provider.  Think about joining a support group for people who want to quit smoking in your local community. There are many effective programs that will help you to quit this behavior. This information is not intended to replace advice given to you by your health care provider. Make sure you discuss any questions you have with your health care provider. Document Released: 05/10/2016 Document Revised: 06/14/2017 Document Reviewed: 05/10/2016 Elsevier Patient Education  2020 [employer].

## 2019-05-08 ENCOUNTER — Ambulatory Visit (INDEPENDENT_AMBULATORY_CARE_PROVIDER_SITE_OTHER): Payer: Medicare Other | Admitting: Obstetrics and Gynecology

## 2019-05-08 ENCOUNTER — Other Ambulatory Visit (HOSPITAL_COMMUNITY)
Admission: RE | Admit: 2019-05-08 | Discharge: 2019-05-08 | Disposition: A | Discharge status: Home / Self Care | Payer: Medicare Other | Source: Ambulatory Visit | Attending: Obstetrics and Gynecology | Admitting: Obstetrics and Gynecology

## 2019-05-08 ENCOUNTER — Other Ambulatory Visit: Payer: Self-pay

## 2019-05-08 ENCOUNTER — Encounter: Payer: Self-pay | Admitting: Obstetrics and Gynecology

## 2019-05-08 VITALS — BP 178/70 | HR 72 | Wt 129.0 lb

## 2019-05-08 DIAGNOSIS — Z1151 Encounter for screening for human papillomavirus (HPV): Secondary | ICD-10-CM | POA: Diagnosis not present

## 2019-05-08 DIAGNOSIS — Z01411 Encounter for gynecological examination (general) (routine) with abnormal findings: Secondary | ICD-10-CM | POA: Insufficient documentation

## 2019-05-08 DIAGNOSIS — Z01419 Encounter for gynecological examination (general) (routine) without abnormal findings: Secondary | ICD-10-CM

## 2019-05-08 DIAGNOSIS — R87810 Cervical high risk human papillomavirus (HPV) DNA test positive: Secondary | ICD-10-CM | POA: Diagnosis not present

## 2019-05-08 NOTE — Progress Notes (Signed)
Subjective:    Valerie Murray is a 63 y.o. female who presents for an annual exam. The patient has no complaints today. The patient is not sexually active. GYN screening history: last pap: was normal. The patient wears seatbelts: yes. The patient participates in regular exercise: yes. She denies any pelvic pain, abnormal discharge or any episodes of postmenopausal vaginal bleeding. Patient denies any urinary incontinence     Past Medical History:  Diagnosis Date  . Aortic valve disease    severe AI  . Arthritis   . Fibromyalgia   . Heart murmur   . Systemic hypertension   . Tobacco abuse    Past Surgical History:  Procedure Laterality Date  . CARDIAC CATHETERIZATION  12/21/2001   moderately severe aortic regurg.,normal patent coronary arteries  . CHOLECYSTECTOMY    . NM MYOCAR PERF WALL MOTION  12/14/2001   anterior wall ischemia  . SHOULDER SURGERY    . US ECHOCARDIOGRAPHY  12/01/2011   mod.to severe AI,mild valvular aortic stenosis,LA mod. dilated,trace MR,mild to mod. TR,severe pulmonary hypertension,trace PI   Family History  Problem Relation Age of Onset  . Stroke Mother   . Heart attack Father   . Hypertension Other   . Cancer Sister        breast   Social History   Tobacco Use  . Smoking status: Current Every Day Smoker    Packs/day: 0.25    Types: Cigarettes  . Smokeless tobacco: Current User  Substance Use Topics  . Alcohol use: Yes    Alcohol/week: 1.0 standard drinks    Types: 1 Glasses of wine per week    Comment: occ  . Drug use: No     Review of Systems Pertinent items noted in HPI and remainder of comprehensive ROS otherwise negative.    Objective:  Blood pressure (!) 178/70, pulse 72, weight 129 lb (58.5 kg). No LMP recorded (lmp unknown). Patient is postmenopausal.   .  GENERAL: Well-developed, well-nourished female in no acute distress.  HEENT: Normocephalic, atraumatic. Sclerae anicteric.  NECK: Supple. Normal thyroid.  LUNGS: Clear to  auscultation bilaterally.  HEART: Regular rate and rhythm. BREASTS: Symmetric in size. No palpable masses or lymphadenopathy, skin changes, or nipple drainage. ABDOMEN: Soft, nontender, nondistended. No organomegaly. PELVIC: Normal external female genitalia. Vagina is pale and atrophic.  Normal discharge. Normal appearing cervix. Uterus is normal in size. No adnexal mass or tenderness. EXTREMITIES: No cyanosis, clubbing, or edema, 2+ distal pulses.    Assessment:    Healthy female exam.    Plan:     Pap smear today Normal mammogram 03/2019 Patient will be contacted with abnormal results Follow up with PCP for HTN

## 2019-05-08 NOTE — Progress Notes (Signed)
Patient presents for Annual Exam   Last Pap: 03/20/2018 HPV Neg HPV 16/18/45 Mammogram: 03/14/2019 WNL per pt AT [facility]   CC: None

## 2019-05-14 LAB — CYTOLOGY - PAP
Adequacy: ABSENT
Comment: NEGATIVE
Diagnosis: NEGATIVE
High risk HPV: POSITIVE — AB

## 2019-08-15 DIAGNOSIS — M21611 Bunion of right foot: Secondary | ICD-10-CM | POA: Diagnosis not present

## 2019-08-15 DIAGNOSIS — M21612 Bunion of left foot: Secondary | ICD-10-CM | POA: Diagnosis not present

## 2019-08-23 DIAGNOSIS — M21611 Bunion of right foot: Secondary | ICD-10-CM | POA: Diagnosis not present

## 2019-08-23 DIAGNOSIS — M21612 Bunion of left foot: Secondary | ICD-10-CM | POA: Diagnosis not present

## 2019-08-23 DIAGNOSIS — I739 Peripheral vascular disease, unspecified: Secondary | ICD-10-CM | POA: Diagnosis not present

## 2019-08-26 DIAGNOSIS — E785 Hyperlipidemia, unspecified: Secondary | ICD-10-CM | POA: Diagnosis not present

## 2019-08-26 DIAGNOSIS — M545 Low back pain: Secondary | ICD-10-CM | POA: Diagnosis not present

## 2019-08-26 DIAGNOSIS — I10 Essential (primary) hypertension: Secondary | ICD-10-CM | POA: Diagnosis not present

## 2019-08-26 DIAGNOSIS — M797 Fibromyalgia: Secondary | ICD-10-CM | POA: Diagnosis not present

## 2019-09-06 DIAGNOSIS — E785 Hyperlipidemia, unspecified: Secondary | ICD-10-CM | POA: Diagnosis not present

## 2019-09-06 DIAGNOSIS — I10 Essential (primary) hypertension: Secondary | ICD-10-CM | POA: Diagnosis not present

## 2019-09-06 DIAGNOSIS — M810 Age-related osteoporosis without current pathological fracture: Secondary | ICD-10-CM | POA: Diagnosis not present

## 2019-09-26 DIAGNOSIS — E785 Hyperlipidemia, unspecified: Secondary | ICD-10-CM | POA: Diagnosis not present

## 2019-09-26 DIAGNOSIS — I10 Essential (primary) hypertension: Secondary | ICD-10-CM | POA: Diagnosis not present

## 2019-10-10 DIAGNOSIS — H5213 Myopia, bilateral: Secondary | ICD-10-CM | POA: Diagnosis not present

## 2019-11-18 DIAGNOSIS — I10 Essential (primary) hypertension: Secondary | ICD-10-CM | POA: Diagnosis not present

## 2019-11-18 DIAGNOSIS — Z1321 Encounter for screening for nutritional disorder: Secondary | ICD-10-CM | POA: Diagnosis not present

## 2019-11-18 DIAGNOSIS — E559 Vitamin D deficiency, unspecified: Secondary | ICD-10-CM | POA: Diagnosis not present

## 2019-11-18 DIAGNOSIS — R748 Abnormal levels of other serum enzymes: Secondary | ICD-10-CM | POA: Diagnosis not present

## 2019-11-18 DIAGNOSIS — I351 Nonrheumatic aortic (valve) insufficiency: Secondary | ICD-10-CM | POA: Diagnosis not present

## 2019-11-18 DIAGNOSIS — Z122 Encounter for screening for malignant neoplasm of respiratory organs: Secondary | ICD-10-CM | POA: Diagnosis not present

## 2019-11-18 DIAGNOSIS — E782 Mixed hyperlipidemia: Secondary | ICD-10-CM | POA: Insufficient documentation

## 2019-11-18 DIAGNOSIS — J449 Chronic obstructive pulmonary disease, unspecified: Secondary | ICD-10-CM | POA: Diagnosis not present

## 2019-11-19 DIAGNOSIS — N289 Disorder of kidney and ureter, unspecified: Secondary | ICD-10-CM | POA: Insufficient documentation

## 2019-11-21 DIAGNOSIS — E559 Vitamin D deficiency, unspecified: Secondary | ICD-10-CM | POA: Insufficient documentation

## 2019-11-27 DIAGNOSIS — F17210 Nicotine dependence, cigarettes, uncomplicated: Secondary | ICD-10-CM | POA: Diagnosis not present

## 2019-11-27 DIAGNOSIS — Z87891 Personal history of nicotine dependence: Secondary | ICD-10-CM | POA: Diagnosis not present

## 2019-12-06 DIAGNOSIS — R918 Other nonspecific abnormal finding of lung field: Secondary | ICD-10-CM | POA: Insufficient documentation

## 2019-12-10 DIAGNOSIS — Z9049 Acquired absence of other specified parts of digestive tract: Secondary | ICD-10-CM | POA: Diagnosis not present

## 2019-12-10 DIAGNOSIS — R918 Other nonspecific abnormal finding of lung field: Secondary | ICD-10-CM | POA: Diagnosis not present

## 2019-12-11 DIAGNOSIS — I10 Essential (primary) hypertension: Secondary | ICD-10-CM | POA: Diagnosis not present

## 2019-12-11 DIAGNOSIS — Z23 Encounter for immunization: Secondary | ICD-10-CM | POA: Diagnosis not present

## 2019-12-11 DIAGNOSIS — Z0000 Encounter for general adult medical examination without abnormal findings: Secondary | ICD-10-CM | POA: Diagnosis not present

## 2020-01-07 DIAGNOSIS — M810 Age-related osteoporosis without current pathological fracture: Secondary | ICD-10-CM | POA: Diagnosis not present

## 2020-01-07 DIAGNOSIS — E785 Hyperlipidemia, unspecified: Secondary | ICD-10-CM | POA: Diagnosis not present

## 2020-01-07 DIAGNOSIS — I10 Essential (primary) hypertension: Secondary | ICD-10-CM | POA: Diagnosis not present

## 2020-01-14 DIAGNOSIS — M810 Age-related osteoporosis without current pathological fracture: Secondary | ICD-10-CM | POA: Diagnosis not present

## 2020-01-14 DIAGNOSIS — I10 Essential (primary) hypertension: Secondary | ICD-10-CM | POA: Diagnosis not present

## 2020-01-14 DIAGNOSIS — M8580 Other specified disorders of bone density and structure, unspecified site: Secondary | ICD-10-CM | POA: Insufficient documentation

## 2020-02-06 DIAGNOSIS — I10 Essential (primary) hypertension: Secondary | ICD-10-CM | POA: Diagnosis not present

## 2020-02-06 DIAGNOSIS — E785 Hyperlipidemia, unspecified: Secondary | ICD-10-CM | POA: Diagnosis not present

## 2020-02-06 DIAGNOSIS — M810 Age-related osteoporosis without current pathological fracture: Secondary | ICD-10-CM | POA: Diagnosis not present

## 2020-02-06 DIAGNOSIS — M85851 Other specified disorders of bone density and structure, right thigh: Secondary | ICD-10-CM | POA: Diagnosis not present

## 2020-02-06 DIAGNOSIS — M8589 Other specified disorders of bone density and structure, multiple sites: Secondary | ICD-10-CM | POA: Diagnosis not present

## 2020-02-06 DIAGNOSIS — M8588 Other specified disorders of bone density and structure, other site: Secondary | ICD-10-CM | POA: Diagnosis not present

## 2020-02-18 DIAGNOSIS — Z2821 Immunization not carried out because of patient refusal: Secondary | ICD-10-CM | POA: Diagnosis not present

## 2020-02-18 DIAGNOSIS — I10 Essential (primary) hypertension: Secondary | ICD-10-CM | POA: Diagnosis not present

## 2020-02-18 DIAGNOSIS — R062 Wheezing: Secondary | ICD-10-CM | POA: Diagnosis not present

## 2020-02-18 DIAGNOSIS — G8929 Other chronic pain: Secondary | ICD-10-CM | POA: Diagnosis not present

## 2020-02-18 DIAGNOSIS — M5442 Lumbago with sciatica, left side: Secondary | ICD-10-CM | POA: Diagnosis not present

## 2020-03-16 ENCOUNTER — Ambulatory Visit (HOSPITAL_COMMUNITY): Payer: Medicare Other | Attending: Cardiology

## 2020-03-16 ENCOUNTER — Other Ambulatory Visit: Payer: Self-pay

## 2020-03-16 DIAGNOSIS — I351 Nonrheumatic aortic (valve) insufficiency: Secondary | ICD-10-CM | POA: Insufficient documentation

## 2020-03-16 LAB — ECHOCARDIOGRAM COMPLETE
AR max vel: 1.94 cm2
AV Area VTI: 1.76 cm2
AV Area mean vel: 1.9 cm2
AV Mean grad: 19 mmHg
AV Peak grad: 43.6 mmHg
Ao pk vel: 3.3 m/s
Area-P 1/2: 4.21 cm2
P 1/2 time: 329 msec
S' Lateral: 2.8 cm

## 2020-03-27 ENCOUNTER — Encounter: Payer: Self-pay | Admitting: *Deleted

## 2020-03-31 ENCOUNTER — Telehealth: Payer: Self-pay

## 2020-03-31 ENCOUNTER — Encounter: Payer: Medicare Other | Admitting: Physician Assistant

## 2020-03-31 ENCOUNTER — Other Ambulatory Visit: Payer: Self-pay

## 2020-03-31 NOTE — Progress Notes (Signed)
  Note, office visit cancelled. Patient showed up in the clinic when the appt was scheduled as virtual. Attempt was made to see if patient wish to reschedule or proceed as virtual case, she left before receiving any instruction. Informed office staff to contact the patient to offer another appt time.  This encounter was created in error - please disregard.

## 2020-03-31 NOTE — Telephone Encounter (Signed)
I called patient to get her ready for her virtual visit with Azalee Course, PA-C. The patient yelled at me stating that she was in the office and how come I am just calling to let her know that this was a virtual visit. I explained to the patient that It was a virtual visit and that I was calling to go over her medication and obtain her vital signs. The patient continued to yell at me because she was upset that someone called and told her to arrive for this appointment at least 15 minutes early for registration/check in. I asked the patient of she could hold while I try to figure out what to do. I informed Dorris Fetch [facility] Medical assistant for Wynema Birch what was going on and she stated that she would go out in the lobby to talk to her.

## 2020-04-07 DIAGNOSIS — I10 Essential (primary) hypertension: Secondary | ICD-10-CM | POA: Diagnosis not present

## 2020-04-07 DIAGNOSIS — E785 Hyperlipidemia, unspecified: Secondary | ICD-10-CM | POA: Diagnosis not present

## 2020-04-07 DIAGNOSIS — M810 Age-related osteoporosis without current pathological fracture: Secondary | ICD-10-CM | POA: Diagnosis not present

## 2020-04-15 DIAGNOSIS — Z1231 Encounter for screening mammogram for malignant neoplasm of breast: Secondary | ICD-10-CM | POA: Diagnosis not present

## 2020-04-15 DIAGNOSIS — Z803 Family history of malignant neoplasm of breast: Secondary | ICD-10-CM | POA: Diagnosis not present

## 2020-09-04 DIAGNOSIS — I351 Nonrheumatic aortic (valve) insufficiency: Secondary | ICD-10-CM | POA: Diagnosis not present

## 2020-09-04 DIAGNOSIS — R911 Solitary pulmonary nodule: Secondary | ICD-10-CM | POA: Diagnosis not present

## 2020-09-04 DIAGNOSIS — N289 Disorder of kidney and ureter, unspecified: Secondary | ICD-10-CM | POA: Diagnosis not present

## 2020-09-04 DIAGNOSIS — I10 Essential (primary) hypertension: Secondary | ICD-10-CM | POA: Diagnosis not present

## 2020-09-04 DIAGNOSIS — E782 Mixed hyperlipidemia: Secondary | ICD-10-CM | POA: Diagnosis not present

## 2020-09-04 DIAGNOSIS — E559 Vitamin D deficiency, unspecified: Secondary | ICD-10-CM | POA: Diagnosis not present

## 2020-09-04 DIAGNOSIS — R918 Other nonspecific abnormal finding of lung field: Secondary | ICD-10-CM | POA: Diagnosis not present

## 2020-09-23 DIAGNOSIS — R918 Other nonspecific abnormal finding of lung field: Secondary | ICD-10-CM | POA: Diagnosis not present

## 2020-09-25 DIAGNOSIS — M546 Pain in thoracic spine: Secondary | ICD-10-CM | POA: Diagnosis not present

## 2020-09-25 DIAGNOSIS — I351 Nonrheumatic aortic (valve) insufficiency: Secondary | ICD-10-CM | POA: Diagnosis not present

## 2020-09-25 DIAGNOSIS — G8929 Other chronic pain: Secondary | ICD-10-CM | POA: Diagnosis not present

## 2020-09-25 DIAGNOSIS — I10 Essential (primary) hypertension: Secondary | ICD-10-CM | POA: Diagnosis not present

## 2020-10-02 DIAGNOSIS — G8929 Other chronic pain: Secondary | ICD-10-CM | POA: Diagnosis not present

## 2020-10-02 DIAGNOSIS — I351 Nonrheumatic aortic (valve) insufficiency: Secondary | ICD-10-CM | POA: Diagnosis not present

## 2020-10-02 DIAGNOSIS — I10 Essential (primary) hypertension: Secondary | ICD-10-CM | POA: Diagnosis not present

## 2020-10-02 DIAGNOSIS — M546 Pain in thoracic spine: Secondary | ICD-10-CM | POA: Insufficient documentation

## 2020-10-02 DIAGNOSIS — M5134 Other intervertebral disc degeneration, thoracic region: Secondary | ICD-10-CM | POA: Diagnosis not present

## 2020-10-19 ENCOUNTER — Ambulatory Visit (INDEPENDENT_AMBULATORY_CARE_PROVIDER_SITE_OTHER): Payer: Medicare Other | Admitting: Podiatry

## 2020-10-19 ENCOUNTER — Ambulatory Visit (INDEPENDENT_AMBULATORY_CARE_PROVIDER_SITE_OTHER): Payer: Medicare Other

## 2020-10-19 ENCOUNTER — Other Ambulatory Visit: Payer: Self-pay

## 2020-10-19 DIAGNOSIS — M79672 Pain in left foot: Secondary | ICD-10-CM

## 2020-10-19 DIAGNOSIS — M2011 Hallux valgus (acquired), right foot: Secondary | ICD-10-CM

## 2020-10-19 NOTE — Progress Notes (Signed)
   Subjective: 65 y.o. female presents today as a new patient for evaluation of a symptomatic bunion to the right foot this been present for several years.  Is progressively gotten worse.  Patient states that she has a history of bunionectomy surgery over 10 years ago to the left foot and it has significantly improved her symptoms and she is doing well.  She would like to have the right foot evaluated and possibly scheduled for bunion surgery.  She has tried multiple conservative modalities including wide shoes however anything she wears is aggravating to the bunion area and it is painful with ambulation.   Past Medical History:  Diagnosis Date   Aortic valve disease    severe AI   Arthritis    Fibromyalgia    Heart murmur    Systemic hypertension    Tobacco abuse       Objective: Physical Exam General: The patient is alert and oriented x3 in no acute distress.  Dermatology: Skin is cool, dry and supple bilateral lower extremities. Negative for open lesions or macerations.  Vascular: Palpable pedal pulses bilaterally. No edema or erythema noted. Capillary refill within normal limits.  Neurological: Epicritic and protective threshold grossly intact bilaterally.   Musculoskeletal Exam: Clinical evidence of bunion deformity noted to the respective foot. There is moderate pain on palpation range of motion of the first MPJ. Lateral deviation of the hallux noted consistent with hallux abductovalgus.  Radiographic Exam: Increased intermetatarsal angle greater than 15 with a hallux abductus angle greater than 30 noted on AP view. Moderate degenerative changes noted within the first MPJ.  History of bunionectomy to the left foot without complication  Assessment: 1. HAV w/ bunion deformity right 2. H/o bunionectomy surgery left. > 10 yrs ago   Plan of Care:  1. Patient was evaluated. X-Rays reviewed. 2. Today we discussed the conservative versus surgical management of the presenting  pathology. The patient opts for surgical management. All possible complications and details of the procedure were explained. All patient questions were answered. No guarantees were expressed or implied. 3. Authorization for surgery was initiated today. Surgery will consist of bunionectomy with first metatarsal osteotomy right 4.  Patient states that she quit smoking 1 year ago.  She will need medical clearance from her PCP prior to surgery 5.  Return to clinic 1 week postop       Felecia Shelling, DPM [facility]  Dr. Felecia Shelling, DPM    [address] [place], [place] [address]                Office [phone]  Fax [phone]

## 2020-10-20 ENCOUNTER — Encounter: Payer: Self-pay | Admitting: Podiatry

## 2020-10-30 ENCOUNTER — Telehealth: Payer: Self-pay | Admitting: Urology

## 2020-10-30 NOTE — Telephone Encounter (Signed)
DOS - 11/19/20   [place] BUNIONECTOMY RIGHT --- [phone]   [facility] EFFECTIVE DATE - 05/09/20   PLAN DEDUCTIBLE -  $0.00 OUT OF POCKET - $7,550.00 W/ $7,550.00 REMAINING COINSURANCE - 20%  COPAY - $0.00   PER [employer] WEB SITE FOR CPT CODE 75883 Notification or Prior Authorization is not required for the requested services.  Decision ID #:[id number]

## 2020-11-05 DIAGNOSIS — E785 Hyperlipidemia, unspecified: Secondary | ICD-10-CM | POA: Diagnosis not present

## 2020-11-05 DIAGNOSIS — M810 Age-related osteoporosis without current pathological fracture: Secondary | ICD-10-CM | POA: Diagnosis not present

## 2020-11-05 DIAGNOSIS — I10 Essential (primary) hypertension: Secondary | ICD-10-CM | POA: Diagnosis not present

## 2020-11-11 DIAGNOSIS — I10 Essential (primary) hypertension: Secondary | ICD-10-CM | POA: Diagnosis not present

## 2020-11-11 DIAGNOSIS — N1831 Chronic kidney disease, stage 3a: Secondary | ICD-10-CM | POA: Insufficient documentation

## 2020-11-12 DIAGNOSIS — I10 Essential (primary) hypertension: Secondary | ICD-10-CM | POA: Diagnosis not present

## 2020-11-19 ENCOUNTER — Telehealth: Payer: Self-pay

## 2020-11-19 NOTE — Telephone Encounter (Signed)
Spoke to Valerie Murray at [facility], [place] office, on 11/18/2020. She stated the Valerie Murray was seen by Valerie Jungling, NP on 11/11/2020 and was told that she was not clear for surgery with Dr. Logan Bores in 11/19/2020 due to her blood pressure bing high. I left a message for Valerie Murray but didn't receive a call back. Left her another message to call me on 11/19/2020. Surgery has been canceled with [facility] until she gets medical clearance from her PCP.

## 2020-11-25 ENCOUNTER — Encounter: Payer: Medicare Other | Admitting: Podiatry

## 2020-12-07 ENCOUNTER — Encounter: Payer: Medicare Other | Admitting: Podiatry

## 2020-12-21 ENCOUNTER — Encounter: Payer: Medicare Other | Admitting: Podiatry

## 2021-01-06 DIAGNOSIS — I10 Essential (primary) hypertension: Secondary | ICD-10-CM | POA: Diagnosis not present

## 2021-01-06 DIAGNOSIS — E785 Hyperlipidemia, unspecified: Secondary | ICD-10-CM | POA: Diagnosis not present

## 2021-01-06 DIAGNOSIS — M810 Age-related osteoporosis without current pathological fracture: Secondary | ICD-10-CM | POA: Diagnosis not present

## 2021-01-25 DIAGNOSIS — I10 Essential (primary) hypertension: Secondary | ICD-10-CM | POA: Diagnosis not present

## 2021-01-25 DIAGNOSIS — M546 Pain in thoracic spine: Secondary | ICD-10-CM | POA: Diagnosis not present

## 2021-01-25 DIAGNOSIS — Z0000 Encounter for general adult medical examination without abnormal findings: Secondary | ICD-10-CM | POA: Diagnosis not present

## 2021-01-25 DIAGNOSIS — Z760 Encounter for issue of repeat prescription: Secondary | ICD-10-CM | POA: Diagnosis not present

## 2021-01-25 DIAGNOSIS — G8929 Other chronic pain: Secondary | ICD-10-CM | POA: Diagnosis not present

## 2021-03-08 DIAGNOSIS — M810 Age-related osteoporosis without current pathological fracture: Secondary | ICD-10-CM | POA: Diagnosis not present

## 2021-03-08 DIAGNOSIS — I10 Essential (primary) hypertension: Secondary | ICD-10-CM | POA: Diagnosis not present

## 2021-03-08 DIAGNOSIS — E785 Hyperlipidemia, unspecified: Secondary | ICD-10-CM | POA: Diagnosis not present

## 2021-04-21 DIAGNOSIS — Z1231 Encounter for screening mammogram for malignant neoplasm of breast: Secondary | ICD-10-CM | POA: Diagnosis not present

## 2021-04-22 NOTE — Progress Notes (Signed)
Cardiology Office Note    Date:  04/27/2021   ID:  Valerie Murray, DOB 31-Dec-1955, MRN [id number]  PCP:  Nathaneil Canary, PA-C  Cardiologist:   Thurmon Fair, MD   No chief complaint on file.    History of Present Illness:  Valerie Murray is a 65 y.o. female with isolated moderate aortic insufficiency.  She is frustrated because her blood pressure is poorly controlled.  She has had issues with "feeling sick" and losing her hair and thinks her medicines are responsible.  She stopped taking the hydrochlorothiazide and the telmisartan.  She is currently taking amlodipine 10 mg daily, clonidine 0.1 mg twice daily and metoprolol succinate 50 mg daily.  Continues to deny problems with shortness of breath at rest or with activity, orthopnea, PND, lower extremity edema, chest pain, palpitations, dizziness or syncope.  She has a trileaflet aortic valve with moderate to severe aortic insufficiency and without dilation of the aortic root and without significant left ventricular dilation or dysfunction on the most recent echocardiogram from 1 year ago.  The etiology of aortic insufficiency is not immediately clear, but some of the changes suggest postinflammatory/rheumatic disease.  Her most recent echocardiogram performed on 03/16/2020.  Clearly there are 3 aortic cusps, which appear thickened around the rim, suggestive of possible rheumatic valve disease.  There is no accompanying mitral valve disease.  She has very mild aortic stenosis, but the gradients are exaggerated due to the increased stroke-volume.  Left ventricular size, systolic and diastolic function remain completely normal.  The aorta is not dilated and there is no coarctation seen.    Past Medical History:  Diagnosis Date   Aortic valve disease    severe AI   Arthritis    Fibromyalgia    Heart murmur    Systemic hypertension    Tobacco abuse     Past Surgical History:  Procedure Laterality Date   CARDIAC  CATHETERIZATION  12/21/2001   moderately severe aortic regurg.,normal patent coronary arteries   CHOLECYSTECTOMY     NM MYOCAR PERF WALL MOTION  12/14/2001   anterior wall ischemia   SHOULDER SURGERY     US ECHOCARDIOGRAPHY  12/01/2011   mod.to severe AI,mild valvular aortic stenosis,LA mod. dilated,trace MR,mild to mod. TR,severe pulmonary hypertension,trace PI    Current Medications: Outpatient Medications Prior to Visit  Medication Sig Dispense Refill   amLODipine (NORVASC) 10 MG tablet Take 1 tablet by mouth daily.     Calcium Carbonate-Vitamin D (CALCIUM + D PO) Take 1 tablet by mouth once a week.     cloNIDine (CATAPRES) 0.1 MG tablet Take 0.1 mg by mouth in the morning and at bedtime.     metoprolol succinate (TOPROL-XL) 50 MG 24 hr tablet Take 50 mg by mouth daily in the afternoon.     telmisartan (MICARDIS) 40 MG tablet Take 1 tablet (40 mg total) by mouth daily. 90 tablet 3   cyclobenzaprine (FLEXERIL) 10 MG tablet Take 10 mg by mouth as directed. (Patient not taking: Reported on 04/23/2021)     rosuvastatin (CRESTOR) 20 MG tablet Take 1 tablet by mouth daily. (Patient not taking: Reported on 04/23/2021)     traMADol (ULTRAM) 50 MG tablet Take 1 tablet (50 mg total) by mouth every 6 (six) hours as needed. (Patient not taking: Reported on 04/23/2021) 12 tablet 0   hydrochlorothiazide (HYDRODIURIL) 25 MG tablet Take 1 tablet by mouth daily. (Patient not taking: Reported on 04/23/2021)     No facility-administered medications prior to  visit.     Allergies:   Gabapentin, Olmesartan, and Rosuvastatin   Social History   Socioeconomic History   Marital status: Single    Spouse name: Not on file   Number of children: Not on file   Years of education: Not on file   Highest education level: Not on file  Occupational History   Not on file  Tobacco Use   Smoking status: Every Day    Packs/day: 0.25    Types: Cigarettes   Smokeless tobacco: Current  Vaping Use   Vaping Use: Never  used  Substance and Sexual Activity   Alcohol use: Yes    Alcohol/week: 1.0 standard drink    Types: 1 Glasses of wine per week    Comment: occ   Drug use: No   Sexual activity: Not Currently    Partners: Male    Birth control/protection: Post-menopausal  Other Topics Concern   Not on file  Social History Narrative   Not on file   Social Determinants of Health   Financial Resource Strain: Not on file  Food Insecurity: Not on file  Transportation Needs: Not on file  Physical Activity: Not on file  Stress: Not on file  Social Connections: Not on file     Family History:  The patient's family history includes Cancer in her sister; Heart attack in her father; Hypertension in an other family member; Stroke in her mother.   ROS:   Please see the history of present illness.    All other systems are reviewed and are negative.   PHYSICAL EXAM:   VS:  BP (!) 218/62 (BP Location: Left Arm, Patient Position: Sitting, Cuff Size: Normal)    Pulse 60    Ht 5\' 7"  (1.702 m)    Wt 130 lb (59 kg)    LMP  (LMP Unknown)    SpO2 96%    BMI 20.36 kg/m      General: Alert, oriented x3, no distress, very lean, borderline underweight Head: no evidence of trauma, PERRL, EOMI, no exophtalmos or lid lag, no myxedema, no xanthelasma; normal ears, nose and oropharynx Neck: normal jugular venous pulsations and no hepatojugular reflux; brisk carotid pulses without delay and no carotid bruits Chest: clear to auscultation, no signs of consolidation by percussion or palpation, normal fremitus, symmetrical and full respiratory excursions Cardiovascular: normal position and quality of the apical impulse, regular rhythm, normal first and second heart sounds, 3/6 early peaking aortic ejection murmur, 3/6 diastolic decrescendo murmur at the left lower sternal border, no rubs or gallops Abdomen: no tenderness or distention, no masses by palpation, no abnormal pulsatility or arterial bruits, normal bowel sounds, no  hepatosplenomegaly Extremities: no clubbing, cyanosis or edema; 2+ radial, ulnar and brachial pulses bilaterally; 2+ right femoral, posterior tibial and dorsalis pedis pulses; 2+ left femoral, posterior tibial and dorsalis pedis pulses; no subclavian or femoral bruits Neurological: grossly nonfocal Psych: Normal mood and affect    Wt Readings from Last 3 Encounters:  04/23/21 130 lb (59 kg)  05/08/19 129 lb (58.5 kg)  03/29/19 129 lb 9.6 oz (58.8 kg)    Studies/Labs Reviewed:   EKG:  EKG is ordered today.  It shows normal sinus rhythm, normal tracing.  QTc 428 ms  ECHO 03/16/2020    1. Left ventricular ejection fraction, by estimation, is 60 to 65%. Left  ventricular ejection fraction by 3D volume is 61 %. The left ventricle has  normal function. The left ventricle has no regional wall motion  abnormalities. There is moderate left  ventricular hypertrophy. Left ventricular diastolic parameters are  indeterminate. The average left ventricular global longitudinal strain is  -23.4 %.   2. Right ventricular systolic function is normal. The right ventricular  size is normal. There is moderately elevated pulmonary artery systolic  pressure. The estimated right ventricular systolic pressure is 60.2 mmHg.   3. The mitral valve is normal in structure. No evidence of mitral valve  regurgitation.   4. The inferior vena cava is normal in size with greater than 50%  respiratory variability, suggesting right atrial pressure of 3 mmHg.   5. The aortic valve is tricuspid. Aortic valve regurgitation is moderate  to severe. Mild aortic valve stenosis. AV gradients in moderate range (MG  22 mmHG) likely secondary to AI, as AVA (1.7 cm^2) and DI (0.53) suggests  mild AS. Moderate to severe AI.  PHT 329 ms, EOA 0.16 cm^2, RV 44 cc.   Comparison(s): 03/11/19 EF 60-65%. AV mean PG, peak PG.  Moderate-severe AI.   Conclusion(s)/Recommendation(s): Consider cardiac MRI for quantification   of aortic regurgitation.    LVIDd:         4.50 cm          LVIDs:         2.80 cm   ASSESSMENT:    1. Severe aortic insufficiency   2. Essential hypertension   3. Smoking   4. Underweight       PLAN:  In order of problems listed above:  Moderate to severe aortic insufficiency: She has a very wide pulse pressure.  This may explain why we cannot control her blood pressure without causing side effects.  Repeat an echocardiogram.  It may be time to perform aortic valve repair/replacement. HTN: Had a lot of muscle cramps when taking hydrochlorothiazide in the stopped when we replaced it with amlodipine, but her blood pressure is not well controlled now.  Interestingly, she did great for years when taking Edarbiclor.  We will start her on chlorthalidone. Smoking cessation strongly encouraged her to quit smoking altogether (she just smokes 4 to 5 cigarettes a day. Underweight: Borderline underweight.  Weight has been essentially unchanged over the last 2 years.    Medication Adjustments/Labs and Tests Ordered: Current medicines are reviewed at length with the patient today.  Concerns regarding medicines are outlined above.  Medication changes, Labs and Tests ordered today are listed in the Patient Instructions below. Patient Instructions  Medication Instructions:  START Chlorthalidone 12.5 mg  (half of the 25 mg tablet) once daily  *If you need a refill on your cardiac medications before your next appointment, please call your pharmacy*   Lab Work: None ordered If you have labs (blood work) drawn today and your tests are completely normal, you will receive your results only by: MyChart Message (if you have MyChart) OR A paper copy in the mail If you have any lab test that is abnormal or we need to change your treatment, we will call you to review the results.   Testing/Procedures: Your physician has requested that you have an echocardiogram. Echocardiography is a painless test  that uses sound waves to create images of your heart. It provides your doctor with information about the size and shape of your heart and how well your hearts chambers and valves are working. You may receive an ultrasound enhancing agent through an IV if needed to better visualize your heart during the echo.This procedure takes approximately one hour. There are no  restrictions for this procedure. This will take place at the [address], [address].   Follow-Up: At [facility], you and your health needs are our priority.  As part of our continuing mission to provide you with exceptional heart care, we have created designated Provider Care Teams.  These Care Teams include your primary Cardiologist (physician) and Advanced Practice Providers (APPs -  Physician Assistants and Nurse Practitioners) who all work together to provide you with the care you need, when you need it.  We recommend signing up for the patient portal called "MyChart".  Sign up information is provided on this After Visit Summary.  MyChart is used to connect with patients for Virtual Visits (Telemedicine).  Patients are able to view lab/test results, encounter notes, upcoming appointments, etc.  Non-urgent messages can be sent to your provider as well.   To learn more about what you can do with MyChart, go to [email or web address].    Your next appointment:   Follow up in one month with pharmD Follow up in 12 months with Dr. Royann Shivers     Signed, Thurmon Fair, MD  04/27/2021 10:08 AM    [facility] [address] [place], [place], [place]  [address] Phone: [phone]; Fax: [phone]

## 2021-04-23 ENCOUNTER — Other Ambulatory Visit: Payer: Self-pay

## 2021-04-23 ENCOUNTER — Encounter: Payer: Self-pay | Admitting: Cardiovascular Disease

## 2021-04-23 ENCOUNTER — Ambulatory Visit (INDEPENDENT_AMBULATORY_CARE_PROVIDER_SITE_OTHER): Payer: Medicare Other | Admitting: Cardiovascular Disease

## 2021-04-23 VITALS — BP 218/62 | HR 60 | Ht 67.0 in | Wt 130.0 lb

## 2021-04-23 DIAGNOSIS — I351 Nonrheumatic aortic (valve) insufficiency: Secondary | ICD-10-CM

## 2021-04-23 DIAGNOSIS — I10 Essential (primary) hypertension: Secondary | ICD-10-CM | POA: Diagnosis not present

## 2021-04-23 DIAGNOSIS — R636 Underweight: Secondary | ICD-10-CM | POA: Diagnosis not present

## 2021-04-23 DIAGNOSIS — F17200 Nicotine dependence, unspecified, uncomplicated: Secondary | ICD-10-CM | POA: Diagnosis not present

## 2021-04-23 MED ORDER — CHLORTHALIDONE 25 MG PO TABS: 12.5000 mg | ORAL_TABLET | Freq: Every day | ORAL | 3 refills | Status: DC

## 2021-04-23 MED ORDER — CHLORTHALIDONE 25 MG PO TABS: 12.5000 mg | ORAL_TABLET | Freq: Every day | ORAL | 1 refills | Status: DC

## 2021-04-23 NOTE — Patient Instructions (Signed)
Medication Instructions:  START Chlorthalidone 12.5 mg  (half of the 25 mg tablet) once daily  *If you need a refill on your cardiac medications before your next appointment, please call your pharmacy*   Lab Work: None ordered If you have labs (blood work) drawn today and your tests are completely normal, you will receive your results only by: MyChart Message (if you have MyChart) OR A paper copy in the mail If you have any lab test that is abnormal or we need to change your treatment, we will call you to review the results.   Testing/Procedures: Your physician has requested that you have an echocardiogram. Echocardiography is a painless test that uses sound waves to create images of your heart. It provides your doctor with information about the size and shape of your heart and how well your hearts chambers and valves are working. You may receive an ultrasound enhancing agent through an IV if needed to better visualize your heart during the echo.This procedure takes approximately one hour. There are no restrictions for this procedure. This will take place at the [address], [address].   Follow-Up: At [facility], you and your health needs are our priority.  As part of our continuing mission to provide you with exceptional heart care, we have created designated Provider Care Teams.  These Care Teams include your primary Cardiologist (physician) and Advanced Practice Providers (APPs -  Physician Assistants and Nurse Practitioners) who all work together to provide you with the care you need, when you need it.  We recommend signing up for the patient portal called "MyChart".  Sign up information is provided on this After Visit Summary.  MyChart is used to connect with patients for Virtual Visits (Telemedicine).  Patients are able to view lab/test results, encounter notes, upcoming appointments, etc.  Non-urgent messages can be sent to your provider as well.   To learn more about what you  can do with MyChart, go to [email or web address].    Your next appointment:   Follow up in one month with pharmD Follow up in 12 months with Dr. Royann Shivers

## 2021-04-26 DIAGNOSIS — E782 Mixed hyperlipidemia: Secondary | ICD-10-CM | POA: Diagnosis not present

## 2021-04-26 DIAGNOSIS — M5442 Lumbago with sciatica, left side: Secondary | ICD-10-CM | POA: Diagnosis not present

## 2021-04-26 DIAGNOSIS — G8929 Other chronic pain: Secondary | ICD-10-CM | POA: Diagnosis not present

## 2021-04-26 DIAGNOSIS — I10 Essential (primary) hypertension: Secondary | ICD-10-CM | POA: Diagnosis not present

## 2021-04-26 DIAGNOSIS — M797 Fibromyalgia: Secondary | ICD-10-CM | POA: Diagnosis not present

## 2021-05-05 ENCOUNTER — Ambulatory Visit: Payer: Medicare Other | Admitting: Obstetrics and Gynecology

## 2021-05-07 DIAGNOSIS — I10 Essential (primary) hypertension: Secondary | ICD-10-CM | POA: Diagnosis not present

## 2021-05-07 DIAGNOSIS — E785 Hyperlipidemia, unspecified: Secondary | ICD-10-CM | POA: Diagnosis not present

## 2021-05-07 DIAGNOSIS — M810 Age-related osteoporosis without current pathological fracture: Secondary | ICD-10-CM | POA: Diagnosis not present

## 2021-05-11 DIAGNOSIS — G479 Sleep disorder, unspecified: Secondary | ICD-10-CM | POA: Diagnosis not present

## 2021-05-11 DIAGNOSIS — I10 Essential (primary) hypertension: Secondary | ICD-10-CM | POA: Diagnosis not present

## 2021-05-15 ENCOUNTER — Other Ambulatory Visit: Payer: Self-pay | Admitting: Cardiovascular Disease

## 2021-05-17 ENCOUNTER — Ambulatory Visit (INDEPENDENT_AMBULATORY_CARE_PROVIDER_SITE_OTHER): Payer: Commercial Managed Care - HMO | Admitting: Obstetrics and Gynecology

## 2021-05-17 ENCOUNTER — Other Ambulatory Visit: Payer: Self-pay

## 2021-05-17 ENCOUNTER — Encounter: Payer: Self-pay | Admitting: Obstetrics and Gynecology

## 2021-05-17 ENCOUNTER — Other Ambulatory Visit (HOSPITAL_COMMUNITY)
Admission: RE | Admit: 2021-05-17 | Discharge: 2021-05-17 | Disposition: A | Discharge status: Home / Self Care | Payer: Medicare Other | Source: Ambulatory Visit | Attending: Obstetrics and Gynecology | Admitting: Obstetrics and Gynecology

## 2021-05-17 VITALS — BP 206/68 | HR 66 | Ht 67.0 in | Wt 134.0 lb

## 2021-05-17 DIAGNOSIS — Z1151 Encounter for screening for human papillomavirus (HPV): Secondary | ICD-10-CM | POA: Diagnosis not present

## 2021-05-17 DIAGNOSIS — Z01419 Encounter for gynecological examination (general) (routine) without abnormal findings: Secondary | ICD-10-CM

## 2021-05-17 NOTE — Progress Notes (Signed)
Pt is here today for yearly exam. LMP/BCM: PM. Last pap: 05/08/19 Neg HPV+. MMG: Ordered today. Pt BP is elevated. States she has a follow up appointment with PCP tomorrow to recheck medications.

## 2021-05-17 NOTE — Progress Notes (Signed)
Subjective:     Valerie Murray is a 66 y.o. female P1 postmenopausal with BMI 20 who is here for a comprehensive physical exam. The patient reports no problems. She denies any episodes of postmenopausal vaginal bleeding. She denies pelvic pain or abnormal discharge. She denies urinary incontinence. She is not sexually active  Past Medical History:  Diagnosis Date   Aortic valve disease    severe AI   Arthritis    Fibromyalgia    Heart murmur    Systemic hypertension    Tobacco abuse    Past Surgical History:  Procedure Laterality Date   CARDIAC CATHETERIZATION  12/21/2001   moderately severe aortic regurg.,normal patent coronary arteries   CHOLECYSTECTOMY     NM MYOCAR PERF WALL MOTION  12/14/2001   anterior wall ischemia   SHOULDER SURGERY     US ECHOCARDIOGRAPHY  12/01/2011   mod.to severe AI,mild valvular aortic stenosis,LA mod. dilated,trace MR,mild to mod. TR,severe pulmonary hypertension,trace PI   Family History  Problem Relation Age of Onset   Stroke Mother    Heart attack Father    Hypertension Other    Cancer Sister        breast    Social History   Socioeconomic History   Marital status: Single    Spouse name: Not on file   Number of children: Not on file   Years of education: Not on file   Highest education level: Not on file  Occupational History   Not on file  Tobacco Use   Smoking status: Every Day    Packs/day: 0.25    Types: Cigarettes   Smokeless tobacco: Current  Vaping Use   Vaping Use: Never used  Substance and Sexual Activity   Alcohol use: Yes    Alcohol/week: 1.0 standard drink    Types: 1 Glasses of wine per week    Comment: occ   Drug use: No   Sexual activity: Not Currently    Partners: Male    Birth control/protection: Post-menopausal  Other Topics Concern   Not on file  Social History Narrative   Not on file   Social Determinants of Health   Financial Resource Strain: Not on file  Food Insecurity: Not on file   Transportation Needs: Not on file  Physical Activity: Not on file  Stress: Not on file  Social Connections: Not on file  Intimate Partner Violence: Not on file   Health Maintenance  Topic Date Due   COVID-19 Vaccine (1) Never done   Pneumonia Vaccine 18+ Years old (1 - PCV) Never done   Hepatitis C Screening  Never done   TETANUS/TDAP  Never done   Zoster Vaccines- Shingrix (1 of 2) Never done   MAMMOGRAM  02/07/2019   DEXA SCAN  12/06/2019   INFLUENZA VACCINE  Never done   COLONOSCOPY (Pts 45-81yrs Insurance coverage will need to be confirmed)  02/06/2026   HPV VACCINES  Aged Out       Review of Systems Pertinent items noted in HPI and remainder of comprehensive ROS otherwise negative.   Objective:  Blood pressure (!) 206/68, pulse 66, height 5\' 7"  (1.702 m), weight 134 lb (60.8 kg).   GENERAL: Well-developed, well-nourished female in no acute distress.  HEENT: Normocephalic, atraumatic. Sclerae anicteric.  NECK: Supple. Normal thyroid.  LUNGS: Clear to auscultation bilaterally.  HEART: Regular rate and rhythm. BREASTS: Symmetric in size. No palpable masses or lymphadenopathy, skin changes, or nipple drainage. ABDOMEN: Soft, nontender, nondistended. No organomegaly. PELVIC: Normal  external female genitalia. Vagina is pale and atrophic.  Normal discharge. Normal appearing cervix. Uterus is normal in size. No adnexal mass or tenderness. Chaperone present during the pelvic exam EXTREMITIES: No cyanosis, clubbing, or edema, 2+ distal pulses.     Assessment:    Healthy female exam.      Plan:    Patient with elevated BP today and states that she is scheduled to see PCP for BP med adjustment Patient with positive HRHPV in 2020, repeat pap smear collected today Patient reports normal mammogram 04/21/21 Patient up to date on Dexa Scan and colonoscopy Patient will be contacted with abnormal results See After Visit Summary for Counseling Recommendations

## 2021-05-18 DIAGNOSIS — I10 Essential (primary) hypertension: Secondary | ICD-10-CM | POA: Diagnosis not present

## 2021-05-21 LAB — CYTOLOGY - PAP
Adequacy: ABSENT
Comment: NEGATIVE
Comment: NEGATIVE
Diagnosis: NEGATIVE
HPV 16: NEGATIVE
HPV 18 / 45: NEGATIVE
High risk HPV: POSITIVE — AB

## 2021-05-21 NOTE — Progress Notes (Signed)
Please inform patient of abnormal pap smear and need for colposcopy

## 2021-05-24 ENCOUNTER — Telehealth: Payer: Self-pay

## 2021-05-24 NOTE — Telephone Encounter (Signed)
Called pt to advise of results and need for colpo. No answer, left vm to call.

## 2021-05-25 ENCOUNTER — Other Ambulatory Visit: Payer: Self-pay

## 2021-05-25 ENCOUNTER — Emergency Department (HOSPITAL_COMMUNITY)
Admission: EM | Admit: 2021-05-25 | Discharge: 2021-05-25 | Disposition: A | Discharge status: Home / Self Care | Payer: Medicare Other | Attending: Emergency Medicine | Admitting: Emergency Medicine

## 2021-05-25 DIAGNOSIS — I160 Hypertensive urgency: Secondary | ICD-10-CM | POA: Insufficient documentation

## 2021-05-25 DIAGNOSIS — Z79899 Other long term (current) drug therapy: Secondary | ICD-10-CM | POA: Insufficient documentation

## 2021-05-25 DIAGNOSIS — I10 Essential (primary) hypertension: Secondary | ICD-10-CM | POA: Diagnosis not present

## 2021-05-25 LAB — BASIC METABOLIC PANEL
Anion gap: 8 (ref 5–15)
BUN: 19 mg/dL (ref 8–23)
CO2: 25 mmol/L (ref 22–32)
Calcium: 9.8 mg/dL (ref 8.9–10.3)
Chloride: 99 mmol/L (ref 98–111)
Creatinine, Ser: 1.15 mg/dL — ABNORMAL HIGH (ref 0.44–1.00)
GFR, Estimated: 53 mL/min — ABNORMAL LOW (ref >60)
Glucose, Bld: 86 mg/dL (ref 70–99)
Potassium: 4.9 mmol/L (ref 3.5–5.1)
Sodium: 132 mmol/L — ABNORMAL LOW (ref 135–145)

## 2021-05-25 LAB — CBC
HCT: 36 % (ref 36.0–46.0)
Hemoglobin: 12.2 g/dL (ref 12.0–15.0)
MCH: 32.7 pg (ref 26.0–34.0)
MCHC: 33.9 g/dL (ref 30.0–36.0)
MCV: 96.5 fL (ref 80.0–100.0)
Platelets: 275 10*3/uL (ref 150–400)
RBC: 3.73 MIL/uL — ABNORMAL LOW (ref 3.87–5.11)
RDW: 12.1 % (ref 11.5–15.5)
WBC: 7.3 10*3/uL (ref 4.0–10.5)
nRBC: 0 % (ref 0.0–0.2)

## 2021-05-25 LAB — TROPONIN I (HIGH SENSITIVITY): Troponin I (High Sensitivity): 7 ng/L (ref <18)

## 2021-05-25 MED ORDER — LISINOPRIL 10 MG PO TABS
10.0000 mg | ORAL_TABLET | Freq: Once | ORAL | Status: AC
  Administered 2021-05-25: 10 mg via ORAL
  Filled 2021-05-25: qty 1

## 2021-05-25 MED ORDER — HYDROCHLOROTHIAZIDE 25 MG PO TABS
25.0000 mg | ORAL_TABLET | Freq: Once | ORAL | Status: AC
  Administered 2021-05-25: 25 mg via ORAL
  Filled 2021-05-25: qty 1

## 2021-05-25 MED ORDER — CLONIDINE HCL 0.1 MG PO TABS
0.1000 mg | ORAL_TABLET | Freq: Once | ORAL | Status: AC
  Administered 2021-05-25: 0.1 mg via ORAL
  Filled 2021-05-25: qty 1

## 2021-05-25 NOTE — Discharge Instructions (Signed)
Please continue to take your home blood pressure medications as prescribed

## 2021-05-25 NOTE — ED Triage Notes (Signed)
Pt. Stated, My Dr. Rhina Brackett me here due to my BP is high.

## 2021-05-25 NOTE — ED Provider Notes (Signed)
[facility] Provider Note   CSN: [id number] Arrival date & time: 05/25/21  1215     History  Chief Complaint  Patient presents with   Hypertension    Valerie Murray is a 66 y.o. female.  Pt is a 66 yo female presenting for asymptomatic hypertension after being sent from pcp's office. Current BP is 226/52. Pt has taken 1/4 home prescriptions today. Has taken metoprolol 50mg  but has NOT taken catapres 0.1 mg, lisinopril 10 mg, or hydrochlorothiazide 25 mg. Denies headache, blurred vision, chest pain, nausea, or vomiting.    Hypertension Pertinent negatives include no chest pain, no abdominal pain and no shortness of breath.      Home Medications Prior to Admission medications   Medication Sig Start Date End Date Taking? Authorizing Provider  Calcium Carbonate-Vitamin D (CALCIUM + D PO) Take 1 tablet by mouth once a week.    [provider]  chlorthalidone (HYGROTON) 25 MG tablet TAKE 1/2 TABLET BY MOUTH EVERY DAY 05/17/21   Croitoru, Mihai, MD  cloNIDine (CATAPRES) 0.1 MG tablet Take 0.1 mg by mouth in the morning and at bedtime. 10/02/20   [provider]  metoprolol succinate (TOPROL-XL) 50 MG 24 hr tablet Take 50 mg by mouth daily in the afternoon. 10/23/20   [provider]  traMADol (ULTRAM) 50 MG tablet Take 1 tablet (50 mg total) by mouth every 6 (six) hours as needed. 10/11/18   Veryl Speak, MD  zolpidem (AMBIEN) 5 MG tablet Take 5 mg by mouth at bedtime as needed. 05/11/21   [provider]      Allergies    Gabapentin, Olmesartan, and Rosuvastatin    Review of Systems   Review of Systems  Constitutional:  Negative for chills and fever.  HENT:  Negative for ear pain and sore throat.   Eyes:  Negative for pain and visual disturbance.  Respiratory:  Negative for cough and shortness of breath.   Cardiovascular:  Negative for chest pain and palpitations.  Gastrointestinal:  Negative for abdominal  pain and vomiting.  Genitourinary:  Negative for dysuria and hematuria.  Musculoskeletal:  Negative for arthralgias and back pain.  Skin:  Negative for color change and rash.  Neurological:  Negative for seizures and syncope.  All other systems reviewed and are negative.  Physical Exam Updated Vital Signs BP (!) 226/52    Pulse (!) 54    Temp 98 F (36.7 C) (Oral)    Resp 16    LMP  (LMP Unknown)    SpO2 100%  Physical Exam Vitals and nursing note reviewed.  Constitutional:      General: She is not in acute distress.    Appearance: She is well-developed.  HENT:     Head: Normocephalic and atraumatic.  Eyes:     Conjunctiva/sclera: Conjunctivae normal.  Cardiovascular:     Rate and Rhythm: Normal rate and regular rhythm.     Heart sounds: No murmur heard. Pulmonary:     Effort: Pulmonary effort is normal. No respiratory distress.     Breath sounds: Normal breath sounds.  Abdominal:     Palpations: Abdomen is soft.     Tenderness: There is no abdominal tenderness.  Musculoskeletal:        General: No swelling.     Cervical back: Neck supple. Neck rigidity: .now.  Skin:    General: Skin is warm and dry.     Capillary Refill: Capillary refill takes less than 2 seconds.  Neurological:     Mental Status: She is alert.  Psychiatric:        Mood and Affect: Mood normal.    ED Results / Procedures / Treatments   Labs (all labs ordered are listed, but only abnormal results are displayed) Labs Reviewed - No data to display  EKG None  Radiology No results found.  Procedures Procedures    Medications Ordered in ED Medications  hydrochlorothiazide (HYDRODIURIL) tablet 25 mg (has no administration in time range)  cloNIDine (CATAPRES) tablet 0.1 mg (has no administration in time range)  lisinopril (ZESTRIL) tablet 10 mg (has no administration in time range)    ED Course/ Medical Decision Making/ A&P                           Medical Decision Making Amount and/or  Complexity of Data Reviewed Labs: ordered. ECG/medicine tests: ordered.  Risk Prescription drug management.   4:15 PM  66 yo female presenting for asymptomatic hypertension after being sent from pcp's office. Current BP is 226/52. Pt has taken 1/4 home prescriptions today. Has taken metoprolol 50mg  but has NOT taken catapres 0.1 mg, lisinopril 10 mg, or hydrochlorothiazide 25 mg.  Stable ECG with no ST segment elevation or depression. Troponin stable. Electrolytes stable. Renal function stable.  Patient's BP improved after home medications given. No signs of end organ damage.  Detailed discussions were had with the patient regarding current findings, and need for close f/u with PCP or on call doctor. The patient has been instructed to return immediately if the symptoms worsen in any way for re-evaluation. Patient verbalized understanding and is in agreement with current care plan. All questions answered prior to discharge.            Final Clinical Impression(s) / ED Diagnoses Final diagnoses:  Hypertensive urgency    Rx / DC Orders ED Discharge Orders     None         Lianne Cure, DO [id number]

## 2021-05-26 ENCOUNTER — Ambulatory Visit (HOSPITAL_COMMUNITY): Payer: Medicare Other | Attending: Cardiovascular Disease

## 2021-05-26 ENCOUNTER — Telehealth: Payer: Self-pay

## 2021-05-26 DIAGNOSIS — I351 Nonrheumatic aortic (valve) insufficiency: Secondary | ICD-10-CM | POA: Insufficient documentation

## 2021-05-26 LAB — ECHOCARDIOGRAM COMPLETE
AR max vel: 1.68 cm2
AV Area VTI: 1.59 cm2
AV Area mean vel: 1.61 cm2
AV Mean grad: 19 mmHg
AV Peak grad: 37.7 mmHg
Ao pk vel: 3.07 m/s
Area-P 1/2: 2.13 cm2
P 1/2 time: 498 msec
S' Lateral: 2.8 cm

## 2021-05-26 NOTE — Telephone Encounter (Signed)
2nd attempt, Called pt to advise need for colpo. No answer, left vm

## 2021-05-27 ENCOUNTER — Encounter: Payer: Self-pay | Admitting: *Deleted

## 2021-06-04 ENCOUNTER — Ambulatory Visit (INDEPENDENT_AMBULATORY_CARE_PROVIDER_SITE_OTHER): Payer: Medicare Other | Admitting: Pharmacist Clinician (PhC)/ Clinical Pharmacy Specialist

## 2021-06-04 ENCOUNTER — Encounter: Payer: Self-pay | Admitting: Pharmacist Clinician (PhC)/ Clinical Pharmacy Specialist

## 2021-06-04 ENCOUNTER — Other Ambulatory Visit: Payer: Self-pay

## 2021-06-04 VITALS — BP 182/78

## 2021-06-04 DIAGNOSIS — I10 Essential (primary) hypertension: Secondary | ICD-10-CM

## 2021-06-04 MED ORDER — LISINOPRIL 10 MG PO TABS: 10.0000 mg | ORAL_TABLET | Freq: Every day | ORAL | 3 refills | Status: DC

## 2021-06-04 NOTE — Patient Instructions (Signed)
Return for a a follow up appointment March 2 at 11 am  Go to the lab in 2 weeks to check kidney function.   Check your blood pressure at home daily and keep record of the readings.  Take your BP meds as follows:  AM:  clonidine 0.1 mg, amlodipine 10 mg, chlorthalidone 25 mg  PM: clonidine 0.1 mg, metoprolol 50 mg, lisinopril 10 mg  Bring all of your meds, your BP cuff and your record of home blood pressures to your next appointment.  Exercise as youre able, try to walk approximately 30 minutes per day.  Keep salt intake to a minimum, especially watch canned and prepared boxed foods.  Eat more fresh fruits and vegetables and fewer canned items.  Avoid eating in fast food restaurants.    HOW TO TAKE YOUR BLOOD PRESSURE: Rest 5 minutes before taking your blood pressure.  Dont smoke or drink caffeinated beverages for at least 30 minutes before. Take your blood pressure before (not after) you eat. Sit comfortably with your back supported and both feet on the floor (dont cross your legs). Elevate your arm to heart level on a table or a desk. Use the proper sized cuff. It should fit smoothly and snugly around your bare upper arm. There should be enough room to slip a fingertip under the cuff. The bottom edge of the cuff should be 1 inch above the crease of the elbow. Ideally, take 3 measurements at one sitting and record the average.

## 2021-06-04 NOTE — Assessment & Plan Note (Signed)
Patients BP in the office today was still elevated at 182/78. After talking with the patient, discovered that she was taking each of her BP medications all throughout the day, rather than taking some in the morning and some at night. We worked with the patient to set up a morning and night routine with her. We also initiated lisinopril 10mg  today to help achieve better blood pressure control which will be reflected in her new regimen as follows.  Her morning medications will include: chlorthalidone 25mg , clonidine 0.1mg , and amlodipine 10mg . Her nighttime medications include: lisinopril 10mg , metoprolol succinate 50mg , and clonidine 0.1mg . The patient will need lab work in two weeks (week of Feb 13th) to check potassium and renal function after initiating lisinopril. Asked patient to try to take blood pressure at home at least three times a week. Ms. Boulay will return for follow-up on March 2nd @ 11:00. We asked the patient to bring in her home BP readings, blood pressure monitor, and blood pressure cuff with her when she returns.

## 2021-06-04 NOTE — Progress Notes (Signed)
06/04/2021 Valerie Murray [birth year] [id number]   HPI: HC is a 66 y/o female with a history of severely uncontrolled hypertension, severe aortic valve disease, COPD, fibromyalgia, and former smoker. She was recently admitted to [facility] on 05/25/2021 for hypertensive urgency after her blood pressure was 231/173 and 200/120 when checked twice at her PCP's office. Upon admission her BP was 226/52 and she had only taken one of four of her home medications that day. She took her metoprolol 7m but not clonidine 0.177m amlodipine 1054mor HCTZ 76m40mt the hospital, she denied headache, blurred vision, chest pain, nausea, or vomiting. She had a stable ECG with no ST segment elevation or depression. Troponin, electrolytes, and renal function were stable. Her BP improved after her home medications were given with no signs of end organ damage. She was advised in detail to follow with her PCP regarding compliance and blood pressure control. Patient had an ECHO on 05/26/2021 that showed LVEF 60-65%, normal right and left ventricle function, and grade I diastolic dysfunction.  Prior to this, she saw Dr. CroiSallyanne Kuster12/16/2022 where her BP was 218/62. He was very concerned with her wide pulse pressure and thought this was why it has been hard to control her blood pressure without causing side effects. It was believed her severe aortic insufficiency us cKoreasing issues with blood pressure control and that it may be time to do a valve repair/replacement.   Today the patient is concerned about whether the generic version of medications actually work over brand name. She states that she usually does not sleep well because the neighborhood is noisy. She reports that she lives alone. When she was hospitalized on 05/25/2021, she did not know why her blood pressure came down so quickly when three of her home medications were given to her at once. Upon discharge no one told her about any medication  changes.  PMH: Hypertension: chlorthalidone, clonidine, Toprol XL  Pain/Fibromyalgia: tramadol, zolpidem   Blood pressure goal < 130/80  Current Medications  Chlorthalidone 12.5mg 80mly - takes around lunch time Metoprolol succinate 50mg 38mkes around 3:00pm Clonidine 0.1mg BI83m takes first thing in the morning and at night Amlodipine 10 mg daily - takes around 4:00pm  Previous Antihypertensives: Telmisartan - stopped herself - made her hair fall out  Family Hx: Has one son, has a granddaughter - healthy; dad had high blood pressure, major heart attack; mom did not have heart problems; two brothers deceased; one brother in hospital currently, had MI years ago; one sister is a diabetic, one sister has cancer  Social Hx: Stopped smoking cigarettes; wine every now and then; no coffee, drinks hot cocoa, sprite or coke sometimes  Diet: Salads, bakes a lot of foods, chicken, not much red meat, vegetables and fruits; does not eat out much; stopped eating popcorn; cooks with some salt, no table salt; eats a lot of seafood  Exercise: No formal exercise; sometimes will walk to [facility] to get her medications   Intolerances: Gabapentin - rash, swelling Olmesartan - frequent urination  Rosuvastatin - legs cramps, made hair fall out  Labs: 05/25/2021: Na 132, K 4.9, Glu 86, BUN 19, SCr 1.15, eGFR 53 04/26/2021: TC 231, TG 151, HDL 39, LDL 164   Wt Readings from Last 3 Encounters:  05/17/21 134 lb (60.8 kg)  04/23/21 130 lb (59 kg)  05/08/19 129 lb (58.5 kg)   BP Readings from Last 3 Encounters:  06/04/21 (!) 182/78  05/25/21 (!) 168/45  05/17/21 (!) 206/68   Pulse Readings from Last 3 Encounters:  05/25/21 (!) 53  05/17/21 66  04/23/21 60    Current Outpatient Medications  Medication Sig Dispense Refill   lisinopril (ZESTRIL) 10 MG tablet Take 1 tablet (10 mg total) by mouth daily. 30 tablet 3   amLODipine (NORVASC) 10 MG tablet Take 10 mg by mouth daily.     Calcium  Carbonate-Vitamin D (CALCIUM + D PO) Take 1 tablet by mouth once a week.     chlorthalidone (HYGROTON) 25 MG tablet TAKE 1/2 TABLET BY MOUTH EVERY DAY 45 tablet 3   cloNIDine (CATAPRES) 0.1 MG tablet Take 0.1 mg by mouth in the morning and at bedtime.     metoprolol succinate (TOPROL-XL) 50 MG 24 hr tablet Take 50 mg by mouth daily in the afternoon.     traMADol (ULTRAM) 50 MG tablet Take 1 tablet (50 mg total) by mouth every 6 (six) hours as needed. 12 tablet 0   zolpidem (AMBIEN) 5 MG tablet Take 5 mg by mouth at bedtime as needed.     No current facility-administered medications for this visit.    Allergies  Allergen Reactions   Gabapentin Rash and Swelling   Olmesartan Other (See Comments)    Frequent urination    Rosuvastatin Other (See Comments)    Leg cramps, "made hair fall out"     Past Medical History:  Diagnosis Date   Aortic valve disease    severe AI   Arthritis    Fibromyalgia    Heart murmur    Systemic hypertension    Tobacco abuse     Blood pressure (!) 182/78.  Essential hypertension Patients BP in the office today was still elevated at 182/78. After talking with the patient, discovered that she was taking each of her BP medications all throughout the day, rather than taking some in the morning and some at night. We worked with the patient to set up a morning and night routine with her. We also initiated lisinopril 44m today to help achieve better blood pressure control which will be reflected in her new regimen as follows.  Her morning medications will include: chlorthalidone 292m clonidine 0.43m37mand amlodipine 45m70mer nighttime medications include: lisinopril 45mg46mtoprolol succinate 50mg,4m clonidine 0.43mg. T30mpatient will need lab work in two weeks (week of Feb 13th) to check potassium and renal function after initiating lisinopril. Asked patient to try to take blood pressure at home at least three times a week. Ms. CaldwelDamaseturn for follow-up  on March 2nd @ 11:00. We asked the patient to bring in her home BP readings, blood pressure monitor, and blood pressure cuff with her when she returns.  SamanthGlee Arvin Candidate  [employer] 2023   I was with patient and student for entire appointment and agree with above assessment and plan.  KristinTommy Medal CPP [facility] [id number] [phone]

## 2021-06-23 DIAGNOSIS — I10 Essential (primary) hypertension: Secondary | ICD-10-CM | POA: Diagnosis not present

## 2021-06-23 LAB — BASIC METABOLIC PANEL
BUN/Creatinine Ratio: 14 (ref 12–28)
BUN: 17 mg/dL (ref 8–27)
CO2: 24 mmol/L (ref 20–29)
Calcium: 9.9 mg/dL (ref 8.7–10.3)
Chloride: 93 mmol/L — ABNORMAL LOW (ref 96–106)
Creatinine, Ser: 1.25 mg/dL — ABNORMAL HIGH (ref 0.57–1.00)
Glucose: 86 mg/dL (ref 70–99)
Potassium: 4.2 mmol/L (ref 3.5–5.2)
Sodium: 130 mmol/L — ABNORMAL LOW (ref 134–144)
eGFR: 48 mL/min/{1.73_m2} — ABNORMAL LOW (ref >59)

## 2021-07-05 ENCOUNTER — Encounter: Payer: Self-pay | Admitting: Obstetrics and Gynecology

## 2021-07-05 ENCOUNTER — Other Ambulatory Visit: Payer: Self-pay

## 2021-07-05 ENCOUNTER — Other Ambulatory Visit (HOSPITAL_COMMUNITY)
Admission: RE | Admit: 2021-07-05 | Discharge: 2021-07-05 | Disposition: A | Discharge status: Home / Self Care | Payer: Medicare Other | Source: Ambulatory Visit | Attending: Obstetrics and Gynecology | Admitting: Obstetrics and Gynecology

## 2021-07-05 ENCOUNTER — Ambulatory Visit (INDEPENDENT_AMBULATORY_CARE_PROVIDER_SITE_OTHER): Payer: Medicare Other | Admitting: Obstetrics and Gynecology

## 2021-07-05 VITALS — BP 217/72 | HR 64 | Wt 127.0 lb

## 2021-07-05 DIAGNOSIS — R87810 Cervical high risk human papillomavirus (HPV) DNA test positive: Secondary | ICD-10-CM | POA: Diagnosis present

## 2021-07-05 NOTE — Progress Notes (Signed)
Patient with normal pap smear and positive HRHPV for 2 consecutive years here for colposcopy  Patient given informed consent, signed copy in the chart, time out was performed.  Placed in lithotomy position. Cervix viewed with speculum and colposcope after application of acetic acid. Atrophic vagina and cervix  Colposcopy adequate?  yes Acetowhite lesions?no Punctation?no Mosaicism?  no Abnormal vasculature?  no Biopsies?no ECC?yes  COMMENTS: Patient was given post procedure instructions.  She will return in 2 weeks for results.  Catalina Antigua, MD

## 2021-07-07 LAB — SURGICAL PATHOLOGY

## 2021-07-08 ENCOUNTER — Telehealth: Payer: Self-pay | Admitting: Pharmacist

## 2021-07-08 ENCOUNTER — Other Ambulatory Visit: Payer: Self-pay

## 2021-07-08 ENCOUNTER — Ambulatory Visit (INDEPENDENT_AMBULATORY_CARE_PROVIDER_SITE_OTHER): Payer: Medicare Other | Admitting: Pharmacist

## 2021-07-08 ENCOUNTER — Encounter: Payer: Self-pay | Admitting: Pharmacist

## 2021-07-08 VITALS — BP 160/70 | HR 62 | Resp 15 | Ht 67.0 in | Wt 129.2 lb

## 2021-07-08 DIAGNOSIS — R634 Abnormal weight loss: Secondary | ICD-10-CM | POA: Insufficient documentation

## 2021-07-08 DIAGNOSIS — I10 Essential (primary) hypertension: Secondary | ICD-10-CM

## 2021-07-08 DIAGNOSIS — R1084 Generalized abdominal pain: Secondary | ICD-10-CM | POA: Insufficient documentation

## 2021-07-08 DIAGNOSIS — R194 Change in bowel habit: Secondary | ICD-10-CM | POA: Insufficient documentation

## 2021-07-08 DIAGNOSIS — R197 Diarrhea, unspecified: Secondary | ICD-10-CM | POA: Insufficient documentation

## 2021-07-08 DIAGNOSIS — K5730 Diverticulosis of large intestine without perforation or abscess without bleeding: Secondary | ICD-10-CM | POA: Insufficient documentation

## 2021-07-08 LAB — BASIC METABOLIC PANEL
BUN/Creatinine Ratio: 19 (ref 12–28)
BUN: 23 mg/dL (ref 8–27)
CO2: 25 mmol/L (ref 20–29)
Calcium: 9.7 mg/dL (ref 8.7–10.3)
Chloride: 101 mmol/L (ref 96–106)
Creatinine, Ser: 1.24 mg/dL — ABNORMAL HIGH (ref 0.57–1.00)
Glucose: 88 mg/dL (ref 70–99)
Potassium: 5 mmol/L (ref 3.5–5.2)
Sodium: 135 mmol/L (ref 134–144)
eGFR: 48 mL/min/{1.73_m2} — ABNORMAL LOW (ref >59)

## 2021-07-08 MED ORDER — CLONIDINE HCL 0.1 MG PO TABS: 0.1000 mg | ORAL_TABLET | Freq: Two times a day (BID) | ORAL | 1 refills | Status: DC

## 2021-07-08 MED ORDER — CHLORTHALIDONE 25 MG PO TABS: 25.0000 mg | ORAL_TABLET | Freq: Every morning | ORAL | 1 refills | Status: DC

## 2021-07-08 MED ORDER — METOPROLOL SUCCINATE ER 50 MG PO TB24: 50.0000 mg | ORAL_TABLET | Freq: Every day | ORAL | 1 refills | Status: AC

## 2021-07-08 MED ORDER — AMLODIPINE BESYLATE 10 MG PO TABS: 10.0000 mg | ORAL_TABLET | Freq: Every morning | ORAL | 1 refills | Status: DC

## 2021-07-08 MED ORDER — LISINOPRIL 10 MG PO TABS: 10.0000 mg | ORAL_TABLET | Freq: Every day | ORAL | 1 refills | Status: DC

## 2021-07-08 NOTE — Progress Notes (Signed)
Patient ID: Susette Seminara                 DOB: 1955/05/27                      MRN: [id number] ? ? ? ? ?HPI: ?Xcaret Morad is a 66 y.o. female referred by Dr. Royann Shivers to HTN clinic. PMH is significant for resistant HTN, smoking, CKD, and COPD.  Patient seen last month in HTN clinic and was confused regarding medications and schedule.  PharmD made patient schedule for patient of which medications to take in morning and evening. ? ?Patient presents today with pill box. Has tried to organize medications into AM and PM dosages. Does not know which tablet is which.  When looking at medication organizer, patient had multiple of the same tablets on the same day and had missed occasional days during the week.  Was not able to follow schedule set up at last visit. However she does endorse that she has been taking her medications more frequently but is not sure which ones. ? ?Reports a stressful home life. Is awoken nightly by car races or gun shots. Is not checking BP at home. ? ?Current HTN meds:  ?AM:  clonidine 0.1 mg, amlodipine 10 mg, chlorthalidone 25 mg ?PM: clonidine 0.1 mg, metoprolol 50 mg, lisinopril 10 mg ? ?Has not been following this schedule.  Unclear what medications she has been taking and at which time. ? ?BP goal: <130/80 ? ?Wt Readings from Last 3 Encounters:  ?07/05/21 127 lb (57.6 kg)  ?05/17/21 134 lb (60.8 kg)  ?04/23/21 130 lb (59 kg)  ? ?BP Readings from Last 3 Encounters:  ?07/05/21 (!) 217/72  ?06/04/21 (!) 182/78  ?05/25/21 (!) 168/45  ? ?Pulse Readings from Last 3 Encounters:  ?07/05/21 64  ?05/25/21 (!) 53  ?05/17/21 66  ? ? ?Renal function: ?Estimated Creatinine Clearance: 40.3 mL/min (A) (by C-G formula based on SCr of 1.25 mg/dL (H)). ? ?Past Medical History:  ?Diagnosis Date  ? Aortic valve disease   ? severe AI  ? Arthritis   ? Fibromyalgia   ? Heart murmur   ? Systemic hypertension   ? Tobacco abuse   ? ? ?Current Outpatient Medications on File Prior to Visit  ?Medication Sig  Dispense Refill  ? amLODipine (NORVASC) 10 MG tablet Take 10 mg by mouth daily.    ? Calcium Carbonate-Vitamin D (CALCIUM + D PO) Take 1 tablet by mouth once a week.    ? chlorthalidone (HYGROTON) 25 MG tablet TAKE 1/2 TABLET BY MOUTH EVERY DAY 45 tablet 3  ? cloNIDine (CATAPRES) 0.1 MG tablet Take 0.1 mg by mouth in the morning and at bedtime.    ? lisinopril (ZESTRIL) 10 MG tablet Take 1 tablet (10 mg total) by mouth daily. 30 tablet 3  ? lisinopril (ZESTRIL) 10 MG tablet Take 1 tablet (10 mg total) by mouth daily. 90 tablet 3  ? metoprolol succinate (TOPROL-XL) 50 MG 24 hr tablet Take 50 mg by mouth daily in the afternoon.    ? traMADol (ULTRAM) 50 MG tablet Take 1 tablet (50 mg total) by mouth every 6 (six) hours as needed. 12 tablet 0  ? zolpidem (AMBIEN) 5 MG tablet Take 5 mg by mouth at bedtime as needed.    ? ?No current facility-administered medications on file prior to visit.  ? ? ?Allergies  ?Allergen Reactions  ? Gabapentin Rash and Swelling  ? Olmesartan Other (See Comments)  ?  Frequent urination   ? Rosuvastatin Other (See Comments)  ?  Leg cramps, "made hair fall out"   ? ? ? ?Assessment/Plan: ? ?1. Hypertension -  Patient BP in room 160/70 which is above goal of <130/80.  Unclear which medications patient is taking or at which times.  Patient endorses compliance with medications, however medication box is not filled correctly.   ? ?Patient would benefit from a pill pack type reminder system.  Called [facility] who said they provide a service where each medication is in an individual package with the date and time of administration. Explained process to patient who thinks this will help her since she is very confused.  No medication changes at this time since it is unknown which ones she is taking.  Will send medications to [facility] to start enrollment in pillpack services. ? ?Continue: ?Amlodipine 10mg  daily ?Chlorthalidone 25mg  daily ?Clonidine 0.1mg  BID ?Lisinopril 10mg  daily ?Toprol XL  50mg  daily ? ?Recheck in 4 weeks ? ? , PharmD, BCACP, CDCES, CPP ?[id number] ?[place], , [place] ?Phone: [phone], Fax: [phone]  ?

## 2021-07-08 NOTE — Patient Instructions (Addendum)
It was nice meeting you today ? ?We will switch your medications today to [facility] because they will pack your prescriptions for you ? ?Their phone number is [phone] ? ?In the morning: ?Clonidine 0.1mg  ?Amlodipine 10mg  ?Chlorthalidone 25mg  ? ?In the evening: ?Clonidine 0.1mg  ?Metoprolol 50mg  ?Lisinopril 10mg  ? ?We will update your lab work today ? ? , PharmD, BCACP, CDCES, CPP ?[id number] ?[place], , [place] ?Phone: [phone], Fax: [phone]  ?

## 2021-07-08 NOTE — Telephone Encounter (Signed)
As discussed in appointment, patient would benefit from Pill Packing services.  Contacted [facility] who verified they can provide this service.  Medications sent to pharmacy.  Left message for pharmacist Lauren to call back ?

## 2021-07-08 NOTE — Telephone Encounter (Signed)
Called [facility] pharmacy again and spoke with technician Rolly Salter.  She received the prescriptions and will process them and contact the patient to set up enrollment and delivery, ?

## 2021-07-14 ENCOUNTER — Telehealth: Payer: Self-pay

## 2021-07-14 NOTE — Telephone Encounter (Signed)
Called [facility] to see if they were enrolled in pill pack. They stated that they were still in the onboarding process. They took my information and will let us know when the status changes. Will route to dr. Delma Officer to make him aware.  ?

## 2021-07-14 NOTE — Telephone Encounter (Signed)
-----   Message from Rollen Sox, [facility] sent at 07/14/2021 10:03 AM EST ----- ?Regarding: [facility] ?Please call [facility] and check status of Pill pack delivery to patient ? ?

## 2021-07-14 NOTE — Telephone Encounter (Signed)
The [facility] called back and stated that couldn't fill some refill to soon but that they would get the pt enough until they can start filling pill packs.  ?

## 2021-08-31 ENCOUNTER — Other Ambulatory Visit: Payer: Self-pay | Admitting: Cardiovascular Disease

## 2021-08-31 DIAGNOSIS — I10 Essential (primary) hypertension: Secondary | ICD-10-CM

## 2022-04-27 DIAGNOSIS — Z1231 Encounter for screening mammogram for malignant neoplasm of breast: Secondary | ICD-10-CM | POA: Diagnosis not present

## 2022-06-15 ENCOUNTER — Ambulatory Visit: Payer: 59 | Attending: Cardiovascular Disease | Admitting: Cardiovascular Disease

## 2022-06-15 ENCOUNTER — Encounter: Payer: Self-pay | Admitting: Cardiovascular Disease

## 2022-06-15 VITALS — BP 210/70 | HR 71 | Ht 66.0 in | Wt 120.2 lb

## 2022-06-15 DIAGNOSIS — I10 Essential (primary) hypertension: Secondary | ICD-10-CM

## 2022-06-15 DIAGNOSIS — I351 Nonrheumatic aortic (valve) insufficiency: Secondary | ICD-10-CM

## 2022-06-15 MED ORDER — CLONIDINE HCL 0.1 MG PO TABS: 0.1000 mg | ORAL_TABLET | Freq: Two times a day (BID) | ORAL | 3 refills | Status: DC

## 2022-06-15 MED ORDER — CHLORTHALIDONE 25 MG PO TABS: 25.0000 mg | ORAL_TABLET | Freq: Every morning | ORAL | 3 refills | Status: DC

## 2022-06-15 NOTE — Progress Notes (Signed)
Cardiology Office Note    Date:  06/15/2022   ID:  Valerie Murray, DOB Mar 28, 1956, MRN [id number]  PCP:  Nathaneil Canary, PA-C  Cardiologist:   Thurmon Fair, MD   Chief Complaint  Patient presents with   Cardiac Valve Problem     History of Present Illness:  Valerie Murray is a 67 y.o. female with asymptomatic isolated severe aortic insufficiency.  She feels well and has no cardiovascular complaints other than her blood pressure being poorly controlled.  She is currently only taking clonidine 0.1 mg twice daily.  She states that she was doing okay when she was also taking chlorthalidone 25 mg once daily, but this prescription has run out and she was unable to get refills.  Her medication list amlodipine, lisinopril and metoprolol as well, but she has stopped taking all of these medications at different times for different types of side effects.  The patient specifically denies any chest pain at rest exertion, dyspnea at rest or with exertion, orthopnea, paroxysmal nocturnal dyspnea, syncope, palpitations, focal neurological deficits, intermittent claudication, lower extremity edema, unexplained weight gain, cough, hemoptysis or wheezing.  She lives independently and takes care of her own household chores.  She has a trileaflet aortic valve with moderate to severe aortic insufficiency and without dilation of the aortic root and without significant left ventricular dilation or dysfunction on the most recent echocardiogram from 1 year ago.  The etiology of aortic insufficiency is not immediately clear, but some of the changes suggest postinflammatory/rheumatic disease.  She was supposed to have an echocardiogram done before this office visit but this has not yet been scheduled.  Her most recent echocardiogram from January 2023.  This showed normal left ventricular function both as estimated by ejection fraction ((60-65%) and global longitudinal LV strain) -22.1%).  The LV end-diastolic  diameter is not dilated at 5.1 cm and she has a remarkably LV end-systolic diameter 2.8 cm.  Diastolic function as measured by mitral velocities appears to be at most slightly decreased (medial e' 5.2, lateral e' 9.6).    Clearly there are 3 aortic cusps, which appear thickened around the rim, suggestive of possible rheumatic valve disease.  There is no accompanying mitral valve disease.  She has very mild aortic stenosis, but the gradients are exaggerated due to the increased stroke-volume.  Left ventricular size, systolic and diastolic function remain completely normal.  The aorta is not dilated and there is no coarctation seen.    Past Medical History:  Diagnosis Date   Aortic valve disease    severe AI   Arthritis    Fibromyalgia    Heart murmur    Systemic hypertension    Tobacco abuse     Past Surgical History:  Procedure Laterality Date   CARDIAC CATHETERIZATION  12/21/2001   moderately severe aortic regurg.,normal patent coronary arteries   CHOLECYSTECTOMY     NM MYOCAR PERF WALL MOTION  12/14/2001   anterior wall ischemia   SHOULDER SURGERY     US ECHOCARDIOGRAPHY  12/01/2011   mod.to severe AI,mild valvular aortic stenosis,LA mod. dilated,trace MR,mild to mod. TR,severe pulmonary hypertension,trace PI    Current Medications: Outpatient Medications Prior to Visit  Medication Sig Dispense Refill   amLODipine (NORVASC) 10 MG tablet Take 1 tablet (10 mg total) by mouth in the morning. 90 tablet 1   Calcium Carbonate-Vitamin D (CALCIUM + D PO) Take 1 tablet by mouth once a week.     lisinopril (ZESTRIL) 10 MG tablet TAKE 1  TABLET BY MOUTH EVERY DAY 90 tablet 1   metoprolol succinate (TOPROL-XL) 50 MG 24 hr tablet Take 1 tablet (50 mg total) by mouth at bedtime. 90 tablet 1   traMADol (ULTRAM) 50 MG tablet Take 1 tablet (50 mg total) by mouth every 6 (six) hours as needed. 12 tablet 0   zolpidem (AMBIEN) 5 MG tablet Take 5 mg by mouth at bedtime as needed.     chlorthalidone  (HYGROTON) 25 MG tablet Take 1 tablet (25 mg total) by mouth in the morning. 90 tablet 1   cloNIDine (CATAPRES) 0.1 MG tablet Take 1 tablet (0.1 mg total) by mouth in the morning and at bedtime. 180 tablet 1   No facility-administered medications prior to visit.     Allergies:   Gabapentin, Olmesartan, and Rosuvastatin   Social History   Socioeconomic History   Marital status: Single    Spouse name: Not on file   Number of children: Not on file   Years of education: Not on file   Highest education level: Not on file  Occupational History   Not on file  Tobacco Use   Smoking status: Former    Packs/day: 0.25    Types: Cigarettes   Smokeless tobacco: Current  Vaping Use   Vaping Use: Never used  Substance and Sexual Activity   Alcohol use: Yes    Alcohol/week: 1.0 standard drink of alcohol    Types: 1 Glasses of wine per week    Comment: occ   Drug use: No   Sexual activity: Not Currently    Partners: Male    Birth control/protection: Post-menopausal  Other Topics Concern   Not on file  Social History Narrative   Not on file   Social Determinants of Health   Financial Resource Strain: Not on file  Food Insecurity: Not on file  Transportation Needs: Not on file  Physical Activity: Not on file  Stress: Not on file  Social Connections: Not on file     Family History:  The patient's family history includes Cancer in her sister; Heart attack in her father; Hypertension in an other family member; Stroke in her mother.   ROS:   Please see the history of present illness.    All other systems are reviewed and are negative.   PHYSICAL EXAM:   VS:  BP (!) 210/70   Pulse 71   Ht 5\' 6"  (1.676 m)   Wt 120 lb 3.2 oz (54.5 kg)   LMP  (LMP Unknown)   SpO2 99%   BMI 19.40 kg/m       General: Alert, oriented x3, no distress, very lean Head: no evidence of trauma, PERRL, EOMI, no exophtalmos or lid lag, no myxedema, no xanthelasma; normal ears, nose and  oropharynx Neck: normal jugular venous pulsations and no hepatojugular reflux; brisk carotid pulses without delay and no carotid bruits Chest: clear to auscultation, no signs of consolidation by percussion or palpation, normal fremitus, symmetrical and full respiratory excursions Cardiovascular: normal position and quality of the apical impulse, regular rhythm, normal first and second heart sounds, 3/6 early peaking aortic ejection murmur radiating in a limited fashion towards the base of the neck, 3/6 prolonged decrescendo diastolic murmur heard best at the right upper sternal border but radiating down to the lower sternal border, no apical murmurs, rubs or gallops Abdomen: no tenderness or distention, no masses by palpation, no abnormal pulsatility or arterial bruits, normal bowel sounds, no hepatosplenomegaly Extremities: no clubbing, cyanosis or edema;  2+ radial, ulnar and brachial pulses bilaterally; 2+ right femoral, posterior tibial and dorsalis pedis pulses; 2+ left femoral, posterior tibial and dorsalis pedis pulses; no subclavian or femoral bruits Neurological: grossly nonfocal Psych: Normal mood and affect    Wt Readings from Last 3 Encounters:  06/15/22 120 lb 3.2 oz (54.5 kg)  07/08/21 129 lb 3.2 oz (58.6 kg)  07/05/21 127 lb (57.6 kg)    Studies/Labs Reviewed:   EKG:  EKG is ordered today.  It shows normal sinus rhythm, normal tracing.  QTc 428 ms  ECHO 05/26/2021    1. Left ventricular ejection fraction, by estimation, is 60 to 65%. The  left ventricle has normal function. The left ventricle has no regional  wall motion abnormalities. Left ventricular diastolic parameters are  consistent with Grade I diastolic  dysfunction (impaired relaxation). The average left ventricular global  longitudinal strain is -22.1 %. The global longitudinal strain is normal.   2. Right ventricular systolic function is normal. The right ventricular  size is normal. Tricuspid regurgitation  signal is inadequate for assessing  PA pressure.   3. Left atrial size was mildly dilated.   4. The mitral valve is normal in structure. No evidence of mitral valve  regurgitation. No evidence of mitral stenosis.   5. The aortic valve is tricuspid. Aortic valve regurgitation is severe.  Mild aortic valve stenosis. Aortic valve area, by VTI measures 1.59 cm.  Aortic valve mean gradient measures 19.0 mmHg.   6. There does appear to be holodiastolic flow reversal in the descending  thoracic aorta though images not ideal.   7. The inferior vena cava is normal in size with greater than 50%  respiratory variability, suggesting right atrial pressure of 3 mmHg.   8. Aortic regurgitation appears severe though LV size and systolic  function are still normal. There also is likely mild aortic stenosis  (leaflets mildly restricted, think gradient is due to more than just high  flow).    LVIDd:         5.10 cm          LVIDs:         2.80 cm  ESV  74 ml  ASSESSMENT:    1. Nonrheumatic aortic valve insufficiency   2. Essential hypertension        PLAN:  In order of problems listed above:  Severe aortic insufficiency: Very wide pulse pressure.  This probably explains her intolerance to many antihypertensive medications.  It seems that she was doing okay when taking clonidine and chlorthalidone and we reestablished that regimen.  She did not feel well on beta-blockers (probably due to worsened diastolic regurgitation with bradycardia but also did not do well with amlodipine which made her feel "sick"..  Will recheck her echocardiogram.  We have discussed many times that the indication for aortic valve replacement may be based on excessive dilation or dysfunction of the left ventricle, rather than just symptoms. HTN: Plan clonidine 0.1 mg twice daily and chlorthalidone 25 mg once daily.  Did not have problems with hypokalemia on this regimen even after she stopped taking ACE inhibitor.  I have  reviewed the risk of clonidine rebound with abrupt interruption. Smoking cessation she has successfully quit smoking and iI am very pleased. Underweight: She has always been very lean and remains borderline underweight even after she quit smoking.  She is not currently losing weight however.    Medication Adjustments/Labs and Tests Ordered: Current medicines are reviewed at length with the  patient today.  Concerns regarding medicines are outlined above.  Medication changes, Labs and Tests ordered today are listed in the Patient Instructions below. Patient Instructions  Medication Instructions:  Your physician recommends that you continue on your current medications as directed. Please refer to the Current Medication list given to you today.  *If you need a refill on your cardiac medications before your next appointment, please call your pharmacy*   Lab Work: NONE If you have labs (blood work) drawn today and your tests are completely normal, you will receive your results only by: [place] (if you have MyChart) OR A paper copy in the mail If you have any lab test that is abnormal or we need to change your treatment, we will call you to review the results.   Testing/Procedures: Your physician has requested that you have an echocardiogram. Echocardiography is a painless test that uses sound waves to create images of your heart. It provides your doctor with information about the size and shape of your heart and how well your heart's chambers and valves are working. This procedure takes approximately one hour. There are no restrictions for this procedure. Please do NOT wear cologne, perfume, aftershave, or lotions (deodorant is allowed). Please arrive 15 minutes prior to your appointment time.    Follow-Up: At [facility], you and your health needs are our priority.  As part of our continuing mission to provide you with exceptional heart care, we have created designated  Provider Care Teams.  These Care Teams include your primary Cardiologist (physician) and Advanced Practice Providers (APPs -  Physician Assistants and Nurse Practitioners) who all work together to provide you with the care you need, when you need it.  We recommend signing up for the patient portal called "MyChart".  Sign up information is provided on this After Visit Summary.  MyChart is used to connect with patients for Virtual Visits (Telemedicine).  Patients are able to view lab/test results, encounter notes, upcoming appointments, etc.  Non-urgent messages can be sent to your provider as well.   To learn more about what you can do with MyChart, go to [email or web address].    Your next appointment:   1 year(s)  Provider:   Sanda Klein, MD   Signed, Sanda Klein, MD  06/15/2022 1:59 PM    [facility] [address], [place], [place]  [address] Phone: [phone]; Fax: [phone]

## 2022-06-15 NOTE — Patient Instructions (Signed)
Medication Instructions:  Your physician recommends that you continue on your current medications as directed. Please refer to the Current Medication list given to you today.  *If you need a refill on your cardiac medications before your next appointment, please call your pharmacy*   Lab Work: NONE If you have labs (blood work) drawn today and your tests are completely normal, you will receive your results only by: [place] (if you have MyChart) OR A paper copy in the mail If you have any lab test that is abnormal or we need to change your treatment, we will call you to review the results.   Testing/Procedures: Your physician has requested that you have an echocardiogram. Echocardiography is a painless test that uses sound waves to create images of your heart. It provides your doctor with information about the size and shape of your heart and how well your heart's chambers and valves are working. This procedure takes approximately one hour. There are no restrictions for this procedure. Please do NOT wear cologne, perfume, aftershave, or lotions (deodorant is allowed). Please arrive 15 minutes prior to your appointment time.    Follow-Up: At [facility], you and your health needs are our priority.  As part of our continuing mission to provide you with exceptional heart care, we have created designated Provider Care Teams.  These Care Teams include your primary Cardiologist (physician) and Advanced Practice Providers (APPs -  Physician Assistants and Nurse Practitioners) who all work together to provide you with the care you need, when you need it.  We recommend signing up for the patient portal called "MyChart".  Sign up information is provided on this After Visit Summary.  MyChart is used to connect with patients for Virtual Visits (Telemedicine).  Patients are able to view lab/test results, encounter notes, upcoming appointments, etc.  Non-urgent messages can be sent to your  provider as well.   To learn more about what you can do with MyChart, go to [email or web address].    Your next appointment:   1 year(s)  Provider:   Sanda Klein, MD

## 2022-07-12 ENCOUNTER — Ambulatory Visit (HOSPITAL_COMMUNITY): Payer: 59 | Attending: Cardiovascular Disease

## 2022-07-12 DIAGNOSIS — I351 Nonrheumatic aortic (valve) insufficiency: Secondary | ICD-10-CM | POA: Diagnosis not present

## 2022-07-12 LAB — ECHOCARDIOGRAM COMPLETE
AR max vel: 1.9 cm2
AV Area VTI: 1.77 cm2
AV Area mean vel: 1.67 cm2
AV Mean grad: 14.6 mmHg
AV Peak grad: 28.7 mmHg
Ao pk vel: 2.68 m/s
Area-P 1/2: 2.59 cm2
P 1/2 time: 406 msec
S' Lateral: 3.2 cm

## 2022-07-13 ENCOUNTER — Telehealth: Payer: Self-pay | Admitting: Emergency Medicine

## 2022-07-13 DIAGNOSIS — I351 Nonrheumatic aortic (valve) insufficiency: Secondary | ICD-10-CM

## 2022-07-13 NOTE — Telephone Encounter (Signed)
Croitoru, Dani Gobble, MD  Sharee Holster, RN Heart is still handling the aortic valve leak very well. No need for surgery. Recheck the echo yearly (order just before her next yearly office appointment please).  Called the patient and gave the information above. She verbalized understanding. She reports that her BP was 158 over "something" she cannot remember. She does not have any symptoms. She said this BP is much better than it was on her last visit. Dr Sallyanne Kuster made aware.  Instructed to call with any questions/concerns.

## 2022-08-26 ENCOUNTER — Telehealth: Payer: Self-pay | Admitting: Cardiovascular Disease

## 2022-08-26 NOTE — Telephone Encounter (Signed)
Thanks - it may time to fix her aortic valve

## 2022-08-26 NOTE — Telephone Encounter (Signed)
Pt c/o medication issue:  1. Name of Medication: cloNIDine (CATAPRES) 0.1 MG tablet chlorthalidone (HYGROTON) 25 MG tablet lisinopril (ZESTRIL) 10 MG tablet metoprolol succinate (TOPROL-XL) 50 MG 24 hr tablet   2. How are you currently taking this medication (dosage and times per day)?   3. Are you having a reaction (difficulty breathing--STAT)? Yes  4. What is your medication issue? Pt isn't able stand up without feeling like she's going to fall out, SOB and has a rash.

## 2022-08-26 NOTE — Telephone Encounter (Signed)
Received stat call from patient who states that she has had significantly worsening shortness of breath. Patient reports that she can hardly walk any where even in her house without getting severely short of breath. Patient is short of breath on the phone as well. Patient denies any weight gain, patient denies swelling, and denies chest pain. Patient reports that this has been on-going but today is the worst that it has been. Patient unable to provide BP readings or HR readings. Advised patient that due to symptoms she should report to the ER or nearest urgent care to be seen. Patient aware and verbalized understanding.   Will forward to MD to make aware.

## 2023-02-20 ENCOUNTER — Other Ambulatory Visit: Payer: Self-pay | Admitting: Cardiovascular Disease

## 2023-02-20 DIAGNOSIS — I10 Essential (primary) hypertension: Secondary | ICD-10-CM

## 2023-05-04 DIAGNOSIS — Z1231 Encounter for screening mammogram for malignant neoplasm of breast: Secondary | ICD-10-CM | POA: Diagnosis not present

## 2023-06-02 ENCOUNTER — Ambulatory Visit (HOSPITAL_COMMUNITY): Payer: 59 | Attending: Internal Medicine

## 2023-06-02 ENCOUNTER — Other Ambulatory Visit (HOSPITAL_COMMUNITY): Payer: Self-pay | Admitting: Emergency Medicine

## 2023-06-02 DIAGNOSIS — I351 Nonrheumatic aortic (valve) insufficiency: Secondary | ICD-10-CM | POA: Diagnosis not present

## 2023-06-02 LAB — ECHOCARDIOGRAM COMPLETE
AR max vel: 1.19 cm2
AV Area VTI: 1.24 cm2
AV Area mean vel: 1.2 cm2
AV Mean grad: 24 mmHg
AV Peak grad: 54.8 mmHg
Ao pk vel: 3.7 m/s
Area-P 1/2: 1.89 cm2
P 1/2 time: 330 ms
S' Lateral: 3.1 cm

## 2023-06-11 NOTE — Progress Notes (Unsigned)
Cardiology Office Note    Date:  06/12/2023   ID:  Valerie Murray, DOB December 22, 1955, MRN [id number]  PCP:  Nathaneil Canary, PA-C  Cardiologist:   Thurmon Fair, MD   No chief complaint on file.    History of Present Illness:  Valerie Murray is a 68 y.o. female with asymptomatic isolated severe aortic insufficiency.  She feels well and has no cardiovascular complaints other than her blood pressure being poorly controlled.  She is currently only taking clonidine 0.1 mg twice daily.  She states that she was doing okay when she was also taking chlorthalidone 25 mg once daily, but this prescription has run out and she was unable to get refills.  Her medication list amlodipine, lisinopril and metoprolol as well, but she has stopped taking all of these medications at different times for different types of side effects.  The patient specifically denies any chest pain at rest exertion, dyspnea at rest or with exertion, orthopnea, paroxysmal nocturnal dyspnea, syncope, palpitations, focal neurological deficits, intermittent claudication, lower extremity edema, unexplained weight gain, cough, hemoptysis or wheezing.  She lives independently and takes care of her own household chores.  She has a trileaflet aortic valve with moderate to severe aortic insufficiency and without dilation of the aortic root and without significant left ventricular dilation or dysfunction on the most recent echocardiogram from 1 year ago.  The etiology of aortic insufficiency is not immediately clear, but some of the changes suggest postinflammatory/rheumatic disease.  She was supposed to have an echocardiogram done before this office visit but this has not yet been scheduled.  Her most recent echocardiogram from January 2023.  This showed normal left ventricular function both as estimated by ejection fraction ((60-65%) and global longitudinal LV strain) -22.1%).  The LV end-diastolic diameter is not dilated at 5.1 cm and  she has a remarkably LV end-systolic diameter 2.8 cm.  Diastolic function as measured by mitral velocities appears to be at most slightly decreased (medial e' 5.2, lateral e' 9.6).    Clearly there are 3 aortic cusps, which appear thickened around the rim, suggestive of possible rheumatic valve disease.  There is no accompanying mitral valve disease.  She has very mild aortic stenosis, but the gradients are exaggerated due to the increased stroke-volume.  Left ventricular size, systolic and diastolic function remain completely normal.  The aorta is not dilated and there is no coarctation seen.    Past Medical History:  Diagnosis Date   Aortic valve disease    severe AI   Arthritis    Fibromyalgia    Heart murmur    Systemic hypertension    Tobacco abuse     Past Surgical History:  Procedure Laterality Date   CARDIAC CATHETERIZATION  12/21/2001   moderately severe aortic regurg.,normal patent coronary arteries   CHOLECYSTECTOMY     NM MYOCAR PERF WALL MOTION  12/14/2001   anterior wall ischemia   SHOULDER SURGERY     US ECHOCARDIOGRAPHY  12/01/2011   mod.to severe AI,mild valvular aortic stenosis,LA mod. dilated,trace MR,mild to mod. TR,severe pulmonary hypertension,trace PI    Current Medications: Outpatient Medications Prior to Visit  Medication Sig Dispense Refill   Calcium Carbonate-Vitamin D (CALCIUM + D PO) Take 1 tablet by mouth once a week.     chlorthalidone (HYGROTON) 25 MG tablet Take 1 tablet (25 mg total) by mouth in the morning. 90 tablet 3   cloNIDine (CATAPRES) 0.1 MG tablet TAKE 1 TABLET BY MOUTH IN THE  MORNING  AND AT BEDTIME 200 tablet 1   metoprolol succinate (TOPROL-XL) 50 MG 24 hr tablet Take 1 tablet (50 mg total) by mouth at bedtime. 90 tablet 1   amLODipine (NORVASC) 10 MG tablet Take 1 tablet (10 mg total) by mouth in the morning. 90 tablet 1   lisinopril (ZESTRIL) 10 MG tablet TAKE 1 TABLET BY MOUTH EVERY DAY 90 tablet 1   traMADol (ULTRAM) 50 MG tablet  Take 1 tablet (50 mg total) by mouth every 6 (six) hours as needed. 12 tablet 0   zolpidem (AMBIEN) 5 MG tablet Take 5 mg by mouth at bedtime as needed.     No facility-administered medications prior to visit.     Allergies:   Gabapentin, Olmesartan, Rosuvastatin, Amlodipine, and Lisinopril   Social History   Socioeconomic History   Marital status: Single    Spouse name: Not on file   Number of children: Not on file   Years of education: Not on file   Highest education level: Not on file  Occupational History   Not on file  Tobacco Use   Smoking status: Former    Current packs/day: 0.25    Types: Cigarettes   Smokeless tobacco: Current  Vaping Use   Vaping status: Never Used  Substance and Sexual Activity   Alcohol use: Yes    Alcohol/week: 1.0 standard drink of alcohol    Types: 1 Glasses of wine per week    Comment: occ   Drug use: No   Sexual activity: Not Currently    Partners: Male    Birth control/protection: Post-menopausal  Other Topics Concern   Not on file  Social History Narrative   Not on file   Social Drivers of Health   Financial Resource Strain: Low Risk  (01/25/2021)   Received from [facility], [facility]   Overall Financial Resource Strain (CARDIA)    Difficulty of Paying Living Expenses: Not hard at all  Food Insecurity: No Food Insecurity (05/11/2021)   Received from [facility], [facility]   Hunger Vital Sign    Worried About Running Out of Food in the Last Year: Never true    Ran Out of Food in the Last Year: Never true  Transportation Needs: No Transportation Needs (01/25/2021)   Received from [facility], [facility]   PRAPARE - Transportation    Lack of Transportation (Medical): No    Lack of Transportation (Non-Medical): No  Physical Activity: Inactive (01/25/2021)   Received from [facility], [facility]   Exercise Vital Sign    Days of Exercise per Week: 0 days    Minutes of Exercise per Session: 0 min  Stress:  Stress Concern Present (01/25/2021)   Received from [facility], [facility] - Occupational Stress Questionnaire    Feeling of Stress : Very much  Social Connections: Unknown (09/21/2021)   Received from [facility], [facility]   Social Network    Social Network: Not on file     Family History:  The patient's family history includes Cancer in her sister; Heart attack in her father; Hypertension in an other family member; Stroke in her mother.   ROS:   Please see the history of present illness.    All other systems are reviewed and are negative.   PHYSICAL EXAM:   VS:  BP (!) 162/58   Pulse (!) 58   Ht 5\' 7"  (1.702 m)   Wt 109 lb 12.8 oz (49.8  kg)   LMP  (LMP Unknown)   SpO2 98%   BMI 17.20 kg/m       General: Alert, oriented x3, no distress, very lean Head: no evidence of trauma, PERRL, EOMI, no exophtalmos or lid lag, no myxedema, no xanthelasma; normal ears, nose and oropharynx Neck: normal jugular venous pulsations and no hepatojugular reflux; brisk carotid pulses without delay and no carotid bruits Chest: clear to auscultation, no signs of consolidation by percussion or palpation, normal fremitus, symmetrical and full respiratory excursions Cardiovascular: normal position and quality of the apical impulse, regular rhythm, normal first and second heart sounds, 3/6 early peaking aortic ejection murmur radiating in a limited fashion towards the base of the neck, 3/6 prolonged decrescendo diastolic murmur heard best at the right upper sternal border but radiating down to the lower sternal border, no apical murmurs, rubs or gallops Abdomen: no tenderness or distention, no masses by palpation, no abnormal pulsatility or arterial bruits, normal bowel sounds, no hepatosplenomegaly Extremities: no clubbing, cyanosis or edema; 2+ radial, ulnar and brachial pulses bilaterally; 2+ right femoral, posterior tibial and dorsalis pedis pulses;  2+ left femoral, posterior tibial and dorsalis pedis pulses; no subclavian or femoral bruits Neurological: grossly nonfocal Psych: Normal mood and affect    Wt Readings from Last 3 Encounters:  06/12/23 109 lb 12.8 oz (49.8 kg)  06/15/22 120 lb 3.2 oz (54.5 kg)  07/08/21 129 lb 3.2 oz (58.6 kg)    Studies/Labs Reviewed:   EKG:  EKG 06/15/2022 shows normal sinus rhythm, normal tracing.  QTc 428 ms.   EKG Interpretation Date/Time:  Monday June 12 2023 11:25:27 EST Ventricular Rate:  58 PR Interval:  188 QRS Duration:  78 QT Interval:  420 QTC Calculation: 412 R Axis:   30  Text Interpretation: Sinus bradycardia Possible Left atrial enlargement Nonspecific T wave abnormality When compared with ECG of 25-May-2021 18:58, PR interval has decreased T wave amplitude has decreased in Inferior leads Confirmed by Mame Twombly ([id number]) on 06/12/2023 11:44:04 AM         ECHO 06/02/2023    1. Left ventricular ejection fraction, by estimation, is 60 to 65%. The  left ventricle has normal function. The left ventricle has no regional  wall motion abnormalities. There is moderate concentric left ventricular  hypertrophy. Left ventricular  diastolic parameters are consistent with Grade I diastolic dysfunction  (impaired relaxation). The average left ventricular global longitudinal  strain is 17.1 %. The global longitudinal strain is abnormal.   2. Right ventricular systolic function is normal. The right ventricular  size is normal.   3. Left atrial size was mildly dilated.   4. A small pericardial effusion is present. The pericardial effusion is  anterior to the right ventricle.   5. The mitral valve is grossly normal. Mild mitral valve regurgitation.  No evidence of mitral stenosis.   6. The aortic valve is abnormal. There is moderate calcification of the  aortic valve. There is severe thickening of the aortic valve. Aortic valve  regurgitation is severe. Moderate aortic valve  stenosis.   7. The inferior vena cava is normal in size with greater than 50%  respiratory variability, suggesting right atrial pressure of 3 mmHg.   Comparison(s): Prior images reviewed side by side. Progresion of aortic  regurgitation. Mildly diminished global longitudinal strain not evaluated  in prior study.   LVIDd:         4.90 cm    LVIDs:  3.10 cm  AI PHT:  330 msec   ASSESSMENT:    1. Severe aortic insufficiency   2. Essential hypertension   3. Smoking   4. Underweight         PLAN:  In order of problems listed above:  Severe aortic insufficiency: Very wide pulse pressure.  This probably explains her intolerance to many antihypertensive medications.  It seems that she was doing okay when taking clonidine and chlorthalidone and we reestablished that regimen.  She did not feel well on beta-blockers (probably due to worsened diastolic regurgitation with bradycardia but also did not do well with amlodipine which made her feel "sick"..  Will recheck her echocardiogram.  We have discussed many times that the indication for aortic valve replacement may be based on excessive dilation or dysfunction of the left ventricle, rather than just symptoms.  HTN: Plan clonidine 0.1 mg twice daily and chlorthalidone 25 mg once daily.  Did not have problems with hypokalemia on this regimen even after she stopped taking ACE inhibitor.  I have reviewed the risk of clonidine rebound with abrupt interruption. Smoking cessation she has successfully quit smoking and iI am very pleased. Underweight: She has always been very lean and remains borderline underweight even after she quit smoking.  She is not currently losing weight however.    Medication Adjustments/Labs and Tests Ordered: Current medicines are reviewed at length with the patient today.  Concerns regarding medicines are outlined above.  Medication changes, Labs and Tests ordered today are listed in the Patient Instructions  below. There are no Patient Instructions on file for this visit.   Signed, Thurmon Fair, MD  06/12/2023 11:44 AM    [facility] [address] [place], [place], [place]  [address] Phone: [phone]; Fax: [phone]

## 2023-06-12 ENCOUNTER — Encounter: Payer: Self-pay | Admitting: Cardiovascular Disease

## 2023-06-12 ENCOUNTER — Ambulatory Visit: Payer: 59 | Attending: Cardiovascular Disease | Admitting: Cardiovascular Disease

## 2023-06-12 VITALS — BP 159/56 | HR 58 | Ht 67.0 in | Wt 109.8 lb

## 2023-06-12 DIAGNOSIS — I10 Essential (primary) hypertension: Secondary | ICD-10-CM | POA: Diagnosis not present

## 2023-06-12 DIAGNOSIS — R636 Underweight: Secondary | ICD-10-CM

## 2023-06-12 DIAGNOSIS — F17200 Nicotine dependence, unspecified, uncomplicated: Secondary | ICD-10-CM

## 2023-06-12 DIAGNOSIS — I351 Nonrheumatic aortic (valve) insufficiency: Secondary | ICD-10-CM

## 2023-06-12 NOTE — Patient Instructions (Signed)
Medication Instructions:  No changes   *If you need a refill on your cardiac medications before your next appointment, please call your pharmacy*   Lab Work:  Not needed   Testing/Procedures:Aug 2025  ---- [address] [facility] [address]  Your physician has requested that you have an echocardiogram. Echocardiography is a painless test that uses sound waves to create images of your heart. It provides your doctor with information about the size and shape of your heart and how well your heart's chambers and valves are working. This procedure takes approximately one hour. There are no restrictions for this procedure. Please do NOT wear cologne, perfume, aftershave, or lotions (deodorant is allowed). Please arrive 15 minutes prior to your appointment time.  Please note: We ask at that you not bring children with you during ultrasound (echo/ vascular) testing. Due to room size and safety concerns, children are not allowed in the ultrasound rooms during exams. Our front office staff cannot provide observation of children in our lobby area while testing is being conducted. An adult accompanying a patient to their appointment will only be allowed in the ultrasound room at the discretion of the ultrasound technician under special circumstances. We apologize for any inconvenience.    Follow-Up: At [facility], you and your health needs are our priority.  As part of our continuing mission to provide you with exceptional heart care, we have created designated Provider Care Teams.  These Care Teams include your primary Cardiologist (physician) and Advanced Practice Providers (APPs -  Physician Assistants and Nurse Practitioners) who all work together to provide you with the care you need, when you need it.     Your next appointment:   12 month(s)  The format for your next appointment:   In Person  Provider:   Thurmon Fair, MD

## 2023-06-14 ENCOUNTER — Encounter: Payer: Self-pay | Admitting: Cardiovascular Disease

## 2023-06-21 ENCOUNTER — Telehealth: Payer: Self-pay | Admitting: Cardiovascular Disease

## 2023-06-21 DIAGNOSIS — I10 Essential (primary) hypertension: Secondary | ICD-10-CM

## 2023-06-21 MED ORDER — CHLORTHALIDONE 25 MG PO TABS: 25.0000 mg | ORAL_TABLET | Freq: Every morning | ORAL | 3 refills | Status: AC

## 2023-06-21 NOTE — Telephone Encounter (Signed)
*  STAT* If patient is at the pharmacy, call can be transferred to refill team.   1. Which medications need to be refilled? (please list name of each medication and dose if known)  chlorthalidone (HYGROTON) 25 MG tablet  2. Which pharmacy/location (including street and city if local pharmacy) is medication to be sent to? [facility] - [place], [place] - [address]  3. Do they need a 30 day or 90 day supply?   90 day supply

## 2023-06-21 NOTE — Telephone Encounter (Signed)
Pt's medication was sent to pt's pharmacy as requested. Confirmation received.

## 2023-08-04 ENCOUNTER — Other Ambulatory Visit: Payer: Self-pay | Admitting: Cardiovascular Disease

## 2023-08-04 DIAGNOSIS — I10 Essential (primary) hypertension: Secondary | ICD-10-CM

## 2023-10-04 DIAGNOSIS — Z01818 Encounter for other preprocedural examination: Secondary | ICD-10-CM | POA: Diagnosis not present

## 2023-10-04 DIAGNOSIS — H2511 Age-related nuclear cataract, right eye: Secondary | ICD-10-CM | POA: Diagnosis not present

## 2023-10-04 DIAGNOSIS — H2512 Age-related nuclear cataract, left eye: Secondary | ICD-10-CM | POA: Diagnosis not present

## 2023-10-27 DIAGNOSIS — I10 Essential (primary) hypertension: Secondary | ICD-10-CM | POA: Diagnosis not present

## 2023-10-27 DIAGNOSIS — H2511 Age-related nuclear cataract, right eye: Secondary | ICD-10-CM | POA: Diagnosis not present

## 2023-11-08 DIAGNOSIS — H2512 Age-related nuclear cataract, left eye: Secondary | ICD-10-CM | POA: Diagnosis not present

## 2023-11-08 DIAGNOSIS — Z87891 Personal history of nicotine dependence: Secondary | ICD-10-CM | POA: Diagnosis not present

## 2023-11-08 DIAGNOSIS — I10 Essential (primary) hypertension: Secondary | ICD-10-CM | POA: Diagnosis not present

## 2023-12-13 ENCOUNTER — Ambulatory Visit (HOSPITAL_COMMUNITY)
Admission: RE | Admit: 2023-12-13 | Discharge: 2023-12-13 | Disposition: A | Discharge status: Home / Self Care | Payer: 59 | Source: Ambulatory Visit | Attending: Cardiovascular Disease | Admitting: Cardiovascular Disease

## 2023-12-13 ENCOUNTER — Ambulatory Visit: Payer: Self-pay | Admitting: Cardiovascular Disease

## 2023-12-13 DIAGNOSIS — I10 Essential (primary) hypertension: Secondary | ICD-10-CM | POA: Insufficient documentation

## 2023-12-13 DIAGNOSIS — R636 Underweight: Secondary | ICD-10-CM | POA: Diagnosis not present

## 2023-12-13 DIAGNOSIS — I351 Nonrheumatic aortic (valve) insufficiency: Secondary | ICD-10-CM

## 2023-12-13 LAB — ECHOCARDIOGRAM COMPLETE
AR max vel: 1.34 cm2
AV Area VTI: 1.34 cm2
AV Area mean vel: 1.24 cm2
AV Mean grad: 17.8 mmHg
AV Peak grad: 33.4 mmHg
Ao pk vel: 2.89 m/s
Area-P 1/2: 3.36 cm2
P 1/2 time: 391 ms
S' Lateral: 3.1 cm

## 2023-12-14 NOTE — Progress Notes (Signed)
 Called and went over results with the patient. She verbalized understanding. Order placed for repeat Echo in one year

## 2024-02-25 ENCOUNTER — Other Ambulatory Visit: Payer: Self-pay | Admitting: Cardiovascular Disease

## 2024-02-25 DIAGNOSIS — I10 Essential (primary) hypertension: Secondary | ICD-10-CM

## 2024-03-28 ENCOUNTER — Telehealth: Payer: Self-pay

## 2024-03-28 NOTE — Telephone Encounter (Signed)
 Paper Work Dropped Off:  [address] [facility]  Date:  03-28-24  Location of paper:  Placed in [facility].  Patient included envelope so it can be mailed or your can call her for pickup  [phone]   03-28-24 VB

## 2024-03-29 NOTE — Telephone Encounter (Signed)
 Mailed paperwork to the patient in envelope provided.   Left message on voicemail that the paperwork has been mailed

## 2024-04-17 ENCOUNTER — Other Ambulatory Visit: Payer: Self-pay | Admitting: Cardiovascular Disease

## 2024-04-17 DIAGNOSIS — I10 Essential (primary) hypertension: Secondary | ICD-10-CM

## 2024-05-17 ENCOUNTER — Other Ambulatory Visit: Payer: Self-pay

## 2024-05-20 LAB — SURGICAL PATHOLOGY
